# Patient Record
Sex: Male | Born: 1935
Health system: Southern US, Community
[De-identification: ages and names within clinical notes are randomized; demographics above are authoritative.]

## PROBLEM LIST (undated history)

## (undated) DIAGNOSIS — J690 Pneumonitis due to inhalation of food and vomit: Secondary | ICD-10-CM

## (undated) DIAGNOSIS — D649 Anemia, unspecified: Secondary | ICD-10-CM

## (undated) DIAGNOSIS — K449 Diaphragmatic hernia without obstruction or gangrene: Secondary | ICD-10-CM

## (undated) DIAGNOSIS — I10 Essential (primary) hypertension: Secondary | ICD-10-CM

## (undated) DIAGNOSIS — K225 Diverticulum of esophagus, acquired: Secondary | ICD-10-CM

## (undated) DIAGNOSIS — E785 Hyperlipidemia, unspecified: Secondary | ICD-10-CM

## (undated) HISTORY — DX: Hyperlipidemia, unspecified: E78.5

## (undated) HISTORY — DX: Diaphragmatic hernia without obstruction or gangrene: K44.9

## (undated) HISTORY — DX: Pneumonitis due to inhalation of food and vomit: J69.0

## (undated) HISTORY — PX: BILATERAL VATS ABLATION: SHX1224

## (undated) HISTORY — DX: Diverticulum of esophagus, acquired: K22.5

## (undated) HISTORY — DX: Essential (primary) hypertension: I10

## (undated) HISTORY — DX: Anemia, unspecified: D64.9

---

## 2009-02-22 DIAGNOSIS — J690 Pneumonitis due to inhalation of food and vomit: Secondary | ICD-10-CM

## 2009-02-22 HISTORY — DX: Pneumonitis due to inhalation of food and vomit: J69.0

## 2010-01-29 ENCOUNTER — Inpatient Hospital Stay (HOSPITAL_COMMUNITY)
Admission: AD | Admit: 2010-01-29 | Discharge: 2010-02-12 | Payer: Self-pay | Attending: Pulmonary Disease | Admitting: Pulmonary Disease

## 2010-02-02 ENCOUNTER — Encounter (INDEPENDENT_AMBULATORY_CARE_PROVIDER_SITE_OTHER): Payer: Self-pay | Admitting: Pulmonary Disease

## 2010-02-20 ENCOUNTER — Ambulatory Visit: Payer: Self-pay | Admitting: Cardiothoracic Surgery

## 2010-03-06 ENCOUNTER — Ambulatory Visit
Admission: RE | Admit: 2010-03-06 | Discharge: 2010-03-06 | Payer: Self-pay | Source: Home / Self Care | Attending: Cardiothoracic Surgery | Admitting: Cardiothoracic Surgery

## 2010-03-06 ENCOUNTER — Encounter
Admission: RE | Admit: 2010-03-06 | Discharge: 2010-03-06 | Payer: Self-pay | Source: Home / Self Care | Attending: Cardiothoracic Surgery | Admitting: Cardiothoracic Surgery

## 2010-03-06 ENCOUNTER — Encounter: Payer: Self-pay | Admitting: Internal Medicine

## 2010-03-17 ENCOUNTER — Ambulatory Visit
Admission: RE | Admit: 2010-03-17 | Discharge: 2010-03-17 | Payer: Self-pay | Source: Home / Self Care | Attending: Internal Medicine | Admitting: Internal Medicine

## 2010-03-19 DIAGNOSIS — E785 Hyperlipidemia, unspecified: Secondary | ICD-10-CM | POA: Insufficient documentation

## 2010-03-19 DIAGNOSIS — J690 Pneumonitis due to inhalation of food and vomit: Secondary | ICD-10-CM | POA: Insufficient documentation

## 2010-03-19 DIAGNOSIS — I10 Essential (primary) hypertension: Secondary | ICD-10-CM | POA: Insufficient documentation

## 2010-03-19 DIAGNOSIS — K219 Gastro-esophageal reflux disease without esophagitis: Secondary | ICD-10-CM | POA: Insufficient documentation

## 2010-03-20 ENCOUNTER — Encounter: Payer: Self-pay | Admitting: Internal Medicine

## 2010-03-23 ENCOUNTER — Ambulatory Visit: Admit: 2010-03-23 | Payer: Self-pay | Admitting: Internal Medicine

## 2010-03-25 ENCOUNTER — Encounter: Payer: Self-pay | Admitting: Cardiothoracic Surgery

## 2010-03-26 NOTE — Assessment & Plan Note (Signed)
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 DC summary reviewed. He was found to have large hiatal hernia, Zenker's diverticulum, esophageal stricture, poor swallowing mechanics with reflux, anemia of chronic disease and probably iron loss. He was continued on Rocephin and azithromycin begun at [facility] before transfer, then discharged on Omnicef. He underwent thoracentesis then VATs throacotomy for treament of the empyema by Dr Donata Clay, who is seeing him in outpatient f/u and did CXR, as is Dr Randa Evens for GI. He has said he was walking 57miles/ day before he got sick, and that he is back to 5 miles now. Denies dyspnea, little cough, no chokiung when he eats or drinks, and not waking choking from sleep to suggest reflux events.    Past History:  Past Medical History: Hyperlipidemia Hypertension Aspiration pneumonia 2011 Empyema 2011 Hiatal hernia esophageal stricture Zenkers diverticulum anemia- iron def  Past Surgical History: Right VATs for empyema 2011- Dr Maren Beach  Review of Systems      See HPI       The patient complains of chest pain.  The patient denies anorexia, fever, weight loss, weight gain, vision loss, decreased hearing, hoarseness, syncope, dyspnea on exertion, peripheral edema, prolonged cough, headaches, hemoptysis, abdominal pain, severe indigestion/heartburn, suspicious skin lesions, unusual weight change, abnormal bleeding, enlarged lymph nodes, and angioedema.         post thoracotomy pain  Physical Exam  Additional Exam:  General: A/Ox3; pleasant and cooperative, NAD, alert, calm SKIN: no rash, lesions NODES: no lymphadenopathy HEENT: [place]/AT, EOM- WNL, Conjuctivae- clear, PERRLA, TM-WNL, Nose- clear, Throat- clear and wnl, Mallampati  II NECK: Supple w/  fair ROM, JVD- none, normal carotid impulses w/o bruits Thyroid- normal to palpation, hoarse, no stridor CHEST: Diminished on right with crackles; clear on left HEART: RRR, no m/g/r heard ABDOMEN: Soft and nl; nml bowel sounds; no organomegaly or masses noted ZOX:WRUE, nl pulses, no edema  NEURO: Grossly intact to observation, ambulatory      Impression & Recommendations:  Problem # 1:  ASPIRATION PNEUMONIA (ICD-507.0)  He is resolving the pneumonia and does not recognize any aspriation event. The main concern is ongoing risk of further reflux and aspiration events. He did not have recognized lung disease at baseline, and he is oxygenating well. By his description he is resuming his impressive daily walking habit, which is great. He will need to have anemia and general medical care tracked by Dr Clarene Duke. He has Thoracic surgery f/u with Dr Maren Beach and GI f/u with Dr Randa Evens, so I will be happy to see him back as needed.   Orders: Consultation Level IV ([id number])  Patient Instructions: 1)  Please schedule a follow-up appointment as needed. 2)  cc Dr Randa Evens, Dr Clarene Duke, Dr Maren Beach

## 2010-04-01 ENCOUNTER — Ambulatory Visit (INDEPENDENT_AMBULATORY_CARE_PROVIDER_SITE_OTHER): Payer: Self-pay | Admitting: Cardiothoracic Surgery

## 2010-04-01 ENCOUNTER — Encounter: Payer: Self-pay | Admitting: Internal Medicine

## 2010-04-01 DIAGNOSIS — J869 Pyothorax without fistula: Secondary | ICD-10-CM

## 2010-04-03 NOTE — Assessment & Plan Note (Signed)
OFFICE VISIT  Antonio Vaughan, Antonio Vaughan DOB:  Dec 25, 1920                                        April 01, 2010 CHART #:  [id number]  CURRENT PROBLEMS: 1. Status post right video-assisted thoracoscopic decortication of     empyema secondary to Klebsiella pneumonia, January 29, 2010. 2. Zenker diverticulum of the cervical esophagus.  HISTORY OF PRESENT ILLNESS:  The patient is an 77 year old male returns for further followup after undergoing right VATS decortication of empyema in mid December 2011.  He is now doing well.  His weight has stabilized at 135 pounds.  He is walking 5 miles a day.  He denies any difficulty swallowing or signs of aspiration.  He has some mild postthoracotomy pain.  MEDICATIONS:  He remains on his baseline meds including Diovan 160, Norvasc 10 mg, and Crestor 10 mg a day.  He is also on Flomax and Prilosec p.r.n.  PHYSICAL EXAMINATION:  Vital Signs:  Blood pressure 170/80, pulse 78, respirations 20, saturation 97%, and weight 135 pounds.  General:  He is alert and pleasant and feels well.  Lungs:  Breath sounds are clear and equal.  The right thoracotomy incision is healed.  Cardiac:  Rhythm is regular.  Abdomen:  Benign.  Neurologic:  Intact and there is no pedal edema.  IMPRESSION AND PLAN:  The patient is recovered from his decortication for empyema and is back to a baseline weight with good energy level.  We discussed surgical options of treating his Zenker diverticulum to prevent further aspiration pneumonia.  At this point, he feels very well and does not wish to undergo further surgery.  At this time, his wife is having significant problems with peripheral vascular disease and pending possible surgery.  He will return in approximately 8 weeks to review the situation again and discuss possible surgical correction of the Zenker diverticulum.  I think, this is a reasonable plan and we will follow up with the patient later this  spring.  Kerin Perna, M.D. Electronically Signed  PV/MEDQ  D:  04/01/2010  T:  04/02/2010  Job:  [id number]  cc:   Joni Fears D. Young, MD, FCCP, FACP James L. Malon Kindle., M.D.

## 2010-04-09 NOTE — Letter (Signed)
Summary: Kathlee Nations Trigt MD  Kathlee Nations Trigt MD   Imported By: Lester [place] 03/30/2010 11:05:03  _____________________________________________________________________  External Attachment:    Type:   Image     Comment:   External Document

## 2010-04-09 NOTE — Miscellaneous (Signed)
Summary: Face to Face Encounter  Face to Face Encounter   Imported By: Sherian Rein 03/31/2010 09:32:33  _____________________________________________________________________  External Attachment:    Type:   Image     Comment:   External Document

## 2010-04-15 NOTE — Letter (Signed)
Summary: Antonio Nations Trigt  MD  Antonio Nations Trigt  MD   Imported By: Lennie Odor 04/10/2010 09:33:21  _____________________________________________________________________  External Attachment:    Type:   Image     Comment:   External Document

## 2010-04-21 NOTE — Letter (Signed)
Summary: [facility]  [facility]   Imported By: Sherian Rein 04/13/2010 07:17:27  _____________________________________________________________________  External Attachment:    Type:   Image     Comment:   External Document

## 2010-05-04 LAB — CBC
HCT: 26.4 % — ABNORMAL LOW (ref 39.0–52.0)
HCT: 29.3 % — ABNORMAL LOW (ref 39.0–52.0)
HCT: 29.4 % — ABNORMAL LOW (ref 39.0–52.0)
Hemoglobin: 8.7 g/dL — ABNORMAL LOW (ref 13.0–17.0)
Hemoglobin: 9.5 g/dL — ABNORMAL LOW (ref 13.0–17.0)
Hemoglobin: 9.7 g/dL — ABNORMAL LOW (ref 13.0–17.0)
MCH: 30.8 pg (ref 26.0–34.0)
MCH: 31.2 pg (ref 26.0–34.0)
MCH: 31.4 pg (ref 26.0–34.0)
MCHC: 32.4 g/dL (ref 30.0–36.0)
MCHC: 33 g/dL (ref 30.0–36.0)
MCHC: 33 g/dL (ref 30.0–36.0)
MCV: 95.1 fL (ref 78.0–100.0)
MCV: 95.1 fL (ref 78.0–100.0)
Platelets: 177 10*3/uL (ref 150–400)
Platelets: 179 10*3/uL (ref 150–400)
Platelets: 183 10*3/uL (ref 150–400)
RBC: 3.08 MIL/uL — ABNORMAL LOW (ref 4.22–5.81)
RBC: 3.09 MIL/uL — ABNORMAL LOW (ref 4.22–5.81)
RBC: 3.14 MIL/uL — ABNORMAL LOW (ref 4.22–5.81)
RDW: 14 % (ref 11.5–15.5)
RDW: 14 % (ref 11.5–15.5)
RDW: 14.1 % (ref 11.5–15.5)
WBC: 3.2 10*3/uL — ABNORMAL LOW (ref 4.0–10.5)
WBC: 4.5 10*3/uL (ref 4.0–10.5)
WBC: 8.5 10*3/uL (ref 4.0–10.5)

## 2010-05-04 LAB — GLUCOSE, CAPILLARY
Glucose-Capillary: 101 mg/dL — ABNORMAL HIGH (ref 70–99)
Glucose-Capillary: 104 mg/dL — ABNORMAL HIGH (ref 70–99)
Glucose-Capillary: 109 mg/dL — ABNORMAL HIGH (ref 70–99)
Glucose-Capillary: 115 mg/dL — ABNORMAL HIGH (ref 70–99)
Glucose-Capillary: 129 mg/dL — ABNORMAL HIGH (ref 70–99)
Glucose-Capillary: 130 mg/dL — ABNORMAL HIGH (ref 70–99)
Glucose-Capillary: 136 mg/dL — ABNORMAL HIGH (ref 70–99)
Glucose-Capillary: 140 mg/dL — ABNORMAL HIGH (ref 70–99)
Glucose-Capillary: 82 mg/dL (ref 70–99)
Glucose-Capillary: 83 mg/dL (ref 70–99)
Glucose-Capillary: 83 mg/dL (ref 70–99)
Glucose-Capillary: 87 mg/dL (ref 70–99)
Glucose-Capillary: 88 mg/dL (ref 70–99)
Glucose-Capillary: 88 mg/dL (ref 70–99)
Glucose-Capillary: 92 mg/dL (ref 70–99)
Glucose-Capillary: 96 mg/dL (ref 70–99)
Glucose-Capillary: 97 mg/dL (ref 70–99)

## 2010-05-04 LAB — DIFFERENTIAL
Basophils Absolute: 0 10*3/uL (ref 0.0–0.1)
Basophils Absolute: 0 10*3/uL (ref 0.0–0.1)
Basophils Relative: 0 % (ref 0–1)
Basophils Relative: 1 % (ref 0–1)
Eosinophils Absolute: 0.1 10*3/uL (ref 0.0–0.7)
Eosinophils Relative: 1 % (ref 0–5)
Eosinophils Relative: 1 % (ref 0–5)
Lymphocytes Relative: 23 % (ref 12–46)
Lymphocytes Relative: 8 % — ABNORMAL LOW (ref 12–46)
Lymphocytes Relative: 9 % — ABNORMAL LOW (ref 12–46)
Lymphs Abs: 0.6 10*3/uL — ABNORMAL LOW (ref 0.7–4.0)
Lymphs Abs: 0.7 10*3/uL (ref 0.7–4.0)
Monocytes Absolute: 0.3 10*3/uL (ref 0.1–1.0)
Monocytes Absolute: 0.5 10*3/uL (ref 0.1–1.0)
Monocytes Relative: 11 % (ref 3–12)
Monocytes Relative: 5 % (ref 3–12)
Monocytes Relative: 5 % (ref 3–12)
Neutro Abs: 1.9 10*3/uL (ref 1.7–7.7)
Neutro Abs: 7.3 10*3/uL (ref 1.7–7.7)
Neutrophils Relative %: 61 % (ref 43–77)
Neutrophils Relative %: 86 % — ABNORMAL HIGH (ref 43–77)

## 2010-05-04 LAB — COMPREHENSIVE METABOLIC PANEL
ALT: 27 U/L (ref 0–53)
AST: 25 U/L (ref 0–37)
AST: 32 U/L (ref 0–37)
Albumin: 1.8 g/dL — ABNORMAL LOW (ref 3.5–5.2)
Albumin: 1.9 g/dL — ABNORMAL LOW (ref 3.5–5.2)
Alkaline Phosphatase: 47 U/L (ref 39–117)
BUN: 11 mg/dL (ref 6–23)
CO2: 28 mEq/L (ref 19–32)
Calcium: 7.7 mg/dL — ABNORMAL LOW (ref 8.4–10.5)
Calcium: 8.1 mg/dL — ABNORMAL LOW (ref 8.4–10.5)
Chloride: 102 mEq/L (ref 96–112)
Chloride: 103 mEq/L (ref 96–112)
Creatinine, Ser: 1.1 mg/dL (ref 0.4–1.5)
Creatinine, Ser: 1.22 mg/dL (ref 0.4–1.5)
GFR calc Af Amer: >60 mL/min (ref >60)
GFR calc Af Amer: >60 mL/min (ref >60)
GFR calc non Af Amer: 56 mL/min — ABNORMAL LOW (ref >60)
Glucose, Bld: 96 mg/dL (ref 70–99)
Potassium: 4 mEq/L (ref 3.5–5.1)
Sodium: 133 mEq/L — ABNORMAL LOW (ref 135–145)
Total Bilirubin: 0.6 mg/dL (ref 0.3–1.2)
Total Bilirubin: 0.7 mg/dL (ref 0.3–1.2)
Total Protein: 4.9 g/dL — ABNORMAL LOW (ref 6.0–8.3)
Total Protein: 5 g/dL — ABNORMAL LOW (ref 6.0–8.3)

## 2010-05-04 LAB — BASIC METABOLIC PANEL
BUN: 16 mg/dL (ref 6–23)
CO2: 29 mEq/L (ref 19–32)
CO2: 30 mEq/L (ref 19–32)
Calcium: 8.4 mg/dL (ref 8.4–10.5)
Calcium: 8.7 mg/dL (ref 8.4–10.5)
Calcium: 9 mg/dL (ref 8.4–10.5)
Chloride: 97 mEq/L (ref 96–112)
Chloride: 97 mEq/L (ref 96–112)
Creatinine, Ser: 0.97 mg/dL (ref 0.4–1.5)
Creatinine, Ser: 0.98 mg/dL (ref 0.4–1.5)
Creatinine, Ser: 1.15 mg/dL (ref 0.4–1.5)
GFR calc Af Amer: >60 mL/min (ref >60)
GFR calc Af Amer: >60 mL/min (ref >60)
GFR calc Af Amer: >60 mL/min (ref >60)
GFR calc non Af Amer: 55 mL/min — ABNORMAL LOW (ref >60)
GFR calc non Af Amer: >60 mL/min (ref >60)
GFR calc non Af Amer: >60 mL/min (ref >60)
Glucose, Bld: 100 mg/dL — ABNORMAL HIGH (ref 70–99)
Glucose, Bld: 88 mg/dL (ref 70–99)
Potassium: 3.8 mEq/L (ref 3.5–5.1)
Potassium: 4 mEq/L (ref 3.5–5.1)
Potassium: 4.2 mEq/L (ref 3.5–5.1)
Sodium: 132 mEq/L — ABNORMAL LOW (ref 135–145)
Sodium: 135 mEq/L (ref 135–145)
Sodium: 135 mEq/L (ref 135–145)
Sodium: 136 mEq/L (ref 135–145)

## 2010-05-04 LAB — CHOLESTEROL, TOTAL: Cholesterol: 85 mg/dL (ref 0–200)

## 2010-05-04 LAB — AMYLASE: Amylase: 43 U/L (ref 0–105)

## 2010-05-04 LAB — MAGNESIUM: Magnesium: 1.9 mg/dL (ref 1.5–2.5)

## 2010-05-04 LAB — TRIGLYCERIDES: Triglycerides: 87 mg/dL (ref <150)

## 2010-05-04 LAB — PHOSPHORUS: Phosphorus: 2.6 mg/dL (ref 2.3–4.6)

## 2010-05-05 LAB — GLUCOSE, CAPILLARY
Glucose-Capillary: 104 mg/dL — ABNORMAL HIGH (ref 70–99)
Glucose-Capillary: 110 mg/dL — ABNORMAL HIGH (ref 70–99)
Glucose-Capillary: 118 mg/dL — ABNORMAL HIGH (ref 70–99)
Glucose-Capillary: 142 mg/dL — ABNORMAL HIGH (ref 70–99)

## 2010-05-05 LAB — DIFFERENTIAL
Eosinophils Absolute: 0.1 10*3/uL (ref 0.0–0.7)
Eosinophils Relative: 2 % (ref 0–5)
Lymphs Abs: 0.9 10*3/uL (ref 0.7–4.0)
Monocytes Absolute: 0.5 10*3/uL (ref 0.1–1.0)
Monocytes Relative: 12 % (ref 3–12)
Neutrophils Relative %: 67 % (ref 43–77)

## 2010-05-05 LAB — BASIC METABOLIC PANEL
BUN: 6 mg/dL (ref 6–23)
BUN: 8 mg/dL (ref 6–23)
CO2: 25 mEq/L (ref 19–32)
CO2: 29 mEq/L (ref 19–32)
Calcium: 7.8 mg/dL — ABNORMAL LOW (ref 8.4–10.5)
Calcium: 8.5 mg/dL (ref 8.4–10.5)
Chloride: 104 mEq/L (ref 96–112)
Chloride: 105 mEq/L (ref 96–112)
Creatinine, Ser: 1.1 mg/dL (ref 0.4–1.5)
Creatinine, Ser: 1.24 mg/dL (ref 0.4–1.5)
GFR calc Af Amer: >60 mL/min (ref >60)
GFR calc Af Amer: >60 mL/min (ref >60)
GFR calc non Af Amer: 55 mL/min — ABNORMAL LOW (ref >60)
GFR calc non Af Amer: >60 mL/min (ref >60)
Glucose, Bld: 186 mg/dL — ABNORMAL HIGH (ref 70–99)
Glucose, Bld: 90 mg/dL (ref 70–99)
Potassium: 4 mEq/L (ref 3.5–5.1)
Potassium: 4.1 mEq/L (ref 3.5–5.1)
Sodium: 135 mEq/L (ref 135–145)
Sodium: 140 mEq/L (ref 135–145)

## 2010-05-05 LAB — COMPREHENSIVE METABOLIC PANEL
ALT: 31 U/L (ref 0–53)
ALT: 52 U/L (ref 0–53)
ALT: 94 U/L — ABNORMAL HIGH (ref 0–53)
AST: 26 U/L (ref 0–37)
AST: 40 U/L — ABNORMAL HIGH (ref 0–37)
AST: 87 U/L — ABNORMAL HIGH (ref 0–37)
Albumin: 2.1 g/dL — ABNORMAL LOW (ref 3.5–5.2)
Albumin: 2.2 g/dL — ABNORMAL LOW (ref 3.5–5.2)
Albumin: 2.2 g/dL — ABNORMAL LOW (ref 3.5–5.2)
Alkaline Phosphatase: 56 U/L (ref 39–117)
Alkaline Phosphatase: 58 U/L (ref 39–117)
BUN: 11 mg/dL (ref 6–23)
BUN: 6 mg/dL (ref 6–23)
CO2: 27 mEq/L (ref 19–32)
CO2: 29 mEq/L (ref 19–32)
Calcium: 7.9 mg/dL — ABNORMAL LOW (ref 8.4–10.5)
Calcium: 8.2 mg/dL — ABNORMAL LOW (ref 8.4–10.5)
Calcium: 8.6 mg/dL (ref 8.4–10.5)
Chloride: 102 mEq/L (ref 96–112)
Chloride: 105 mEq/L (ref 96–112)
Creatinine, Ser: 1.27 mg/dL (ref 0.4–1.5)
Creatinine, Ser: 1.28 mg/dL (ref 0.4–1.5)
GFR calc Af Amer: >60 mL/min (ref >60)
GFR calc Af Amer: >60 mL/min (ref >60)
GFR calc Af Amer: >60 mL/min (ref >60)
GFR calc non Af Amer: 53 mL/min — ABNORMAL LOW (ref >60)
GFR calc non Af Amer: 54 mL/min — ABNORMAL LOW (ref >60)
Glucose, Bld: 83 mg/dL (ref 70–99)
Glucose, Bld: 90 mg/dL (ref 70–99)
Potassium: 3.4 mEq/L — ABNORMAL LOW (ref 3.5–5.1)
Potassium: 3.9 mEq/L (ref 3.5–5.1)
Potassium: 4.1 mEq/L (ref 3.5–5.1)
Sodium: 134 mEq/L — ABNORMAL LOW (ref 135–145)
Sodium: 137 mEq/L (ref 135–145)
Sodium: 141 mEq/L (ref 135–145)
Total Bilirubin: 0.5 mg/dL (ref 0.3–1.2)
Total Bilirubin: 0.6 mg/dL (ref 0.3–1.2)
Total Protein: 5.4 g/dL — ABNORMAL LOW (ref 6.0–8.3)
Total Protein: 5.7 g/dL — ABNORMAL LOW (ref 6.0–8.3)
Total Protein: 6.1 g/dL (ref 6.0–8.3)

## 2010-05-05 LAB — BLOOD GAS, ARTERIAL
Acid-Base Excess: 2.1 mmol/L — ABNORMAL HIGH (ref 0.0–2.0)
Acid-Base Excess: 3.7 mmol/L — ABNORMAL HIGH (ref 0.0–2.0)
Bicarbonate: 25.7 mEq/L — ABNORMAL HIGH (ref 20.0–24.0)
Bicarbonate: 27 mEq/L — ABNORMAL HIGH (ref 20.0–24.0)
Drawn by: 22251
Drawn by: 30575
FIO2: 21 %
O2 Content: 5 L/min
O2 Saturation: 94.2 %
O2 Saturation: 96.2 %
Patient temperature: 98.6
Patient temperature: 98.6
TCO2: 26.8 mmol/L (ref 0–100)
TCO2: 28.1 mmol/L (ref 0–100)
pCO2 arterial: 35.8 mmHg (ref 35.0–45.0)
pCO2 arterial: 37.2 mmHg (ref 35.0–45.0)
pH, Arterial: 7.454 — ABNORMAL HIGH (ref 7.350–7.450)
pH, Arterial: 7.491 — ABNORMAL HIGH (ref 7.350–7.450)
pO2, Arterial: 64.4 mmHg — ABNORMAL LOW (ref 80.0–100.0)
pO2, Arterial: 70.4 mmHg — ABNORMAL LOW (ref 80.0–100.0)

## 2010-05-05 LAB — CBC
HCT: 26.9 % — ABNORMAL LOW (ref 39.0–52.0)
HCT: 27.5 % — ABNORMAL LOW (ref 39.0–52.0)
HCT: 31.5 % — ABNORMAL LOW (ref 39.0–52.0)
HCT: 32.2 % — ABNORMAL LOW (ref 39.0–52.0)
Hemoglobin: 10.3 g/dL — ABNORMAL LOW (ref 13.0–17.0)
Hemoglobin: 10.5 g/dL — ABNORMAL LOW (ref 13.0–17.0)
Hemoglobin: 8.8 g/dL — ABNORMAL LOW (ref 13.0–17.0)
Hemoglobin: 9 g/dL — ABNORMAL LOW (ref 13.0–17.0)
MCH: 30.7 pg (ref 26.0–34.0)
MCH: 31 pg (ref 26.0–34.0)
MCH: 31.4 pg (ref 26.0–34.0)
MCH: 31.7 pg (ref 26.0–34.0)
MCHC: 32.6 g/dL (ref 30.0–36.0)
MCHC: 32.7 g/dL (ref 30.0–36.0)
MCHC: 32.7 g/dL (ref 30.0–36.0)
MCHC: 32.7 g/dL (ref 30.0–36.0)
MCHC: 32.9 g/dL (ref 30.0–36.0)
MCV: 93.8 fL (ref 78.0–100.0)
MCV: 95 fL (ref 78.0–100.0)
MCV: 96.1 fL (ref 78.0–100.0)
MCV: 96.8 fL (ref 78.0–100.0)
Platelets: 205 10*3/uL (ref 150–400)
Platelets: 225 10*3/uL (ref 150–400)
Platelets: 251 10*3/uL (ref 150–400)
Platelets: 264 10*3/uL (ref 150–400)
RBC: 2.8 MIL/uL — ABNORMAL LOW (ref 4.22–5.81)
RBC: 2.84 MIL/uL — ABNORMAL LOW (ref 4.22–5.81)
RBC: 3.36 MIL/uL — ABNORMAL LOW (ref 4.22–5.81)
RBC: 3.39 MIL/uL — ABNORMAL LOW (ref 4.22–5.81)
RDW: 12.8 % (ref 11.5–15.5)
RDW: 13.1 % (ref 11.5–15.5)
RDW: 13.1 % (ref 11.5–15.5)
RDW: 14.1 % (ref 11.5–15.5)
RDW: 14.3 % (ref 11.5–15.5)
WBC: 11.1 10*3/uL — ABNORMAL HIGH (ref 4.0–10.5)
WBC: 4.5 10*3/uL (ref 4.0–10.5)
WBC: 4.6 10*3/uL (ref 4.0–10.5)
WBC: 5.3 10*3/uL (ref 4.0–10.5)
WBC: 9.6 10*3/uL (ref 4.0–10.5)

## 2010-05-05 LAB — PROTIME-INR
INR: 1.27 (ref 0.00–1.49)
INR: 1.31 (ref 0.00–1.49)
Prothrombin Time: 16.5 seconds — ABNORMAL HIGH (ref 11.6–15.2)

## 2010-05-05 LAB — URINALYSIS, ROUTINE W REFLEX MICROSCOPIC
Glucose, UA: NEGATIVE mg/dL
Leukocytes, UA: NEGATIVE
Protein, ur: NEGATIVE mg/dL
Specific Gravity, Urine: 1.007 (ref 1.005–1.030)

## 2010-05-05 LAB — CROSSMATCH
ABO/RH(D): A POS
Antibody Screen: NEGATIVE
Unit division: 0
Unit division: 0

## 2010-05-05 LAB — POCT I-STAT 3, ART BLOOD GAS (G3+)
Acid-Base Excess: 1 mmol/L (ref 0.0–2.0)
Bicarbonate: 26.3 mEq/L — ABNORMAL HIGH (ref 20.0–24.0)
TCO2: 28 mmol/L (ref 0–100)
pH, Arterial: 7.414 (ref 7.350–7.450)
pO2, Arterial: 108 mmHg — ABNORMAL HIGH (ref 80.0–100.0)

## 2010-05-05 LAB — ABO/RH: ABO/RH(D): A POS

## 2010-05-05 LAB — ANAEROBIC CULTURE

## 2010-05-05 LAB — URINE MICROSCOPIC-ADD ON

## 2010-05-05 LAB — HEMOCCULT GUIAC POC 1CARD (OFFICE): Fecal Occult Bld: NEGATIVE

## 2010-05-05 LAB — BODY FLUID CULTURE: Culture: NO GROWTH

## 2010-05-05 LAB — APTT: aPTT: 33 seconds (ref 24–37)

## 2010-06-02 ENCOUNTER — Other Ambulatory Visit: Payer: Self-pay | Admitting: Cardiothoracic Surgery

## 2010-06-02 DIAGNOSIS — D381 Neoplasm of uncertain behavior of trachea, bronchus and lung: Secondary | ICD-10-CM

## 2010-06-03 ENCOUNTER — Ambulatory Visit
Admission: RE | Admit: 2010-06-03 | Discharge: 2010-06-03 | Disposition: A | Discharge status: Home / Self Care | Payer: Medicare Other | Source: Ambulatory Visit | Attending: Cardiothoracic Surgery | Admitting: Cardiothoracic Surgery

## 2010-06-03 ENCOUNTER — Ambulatory Visit (INDEPENDENT_AMBULATORY_CARE_PROVIDER_SITE_OTHER): Payer: Medicare Other | Admitting: Cardiothoracic Surgery

## 2010-06-03 DIAGNOSIS — D381 Neoplasm of uncertain behavior of trachea, bronchus and lung: Secondary | ICD-10-CM

## 2010-06-03 DIAGNOSIS — J869 Pyothorax without fistula: Secondary | ICD-10-CM

## 2010-06-04 NOTE — Assessment & Plan Note (Signed)
OFFICE VISIT  JENNIFER, HOLLAND DOB:  October 14, 1920                                        June 03, 2010 CHART #:  [id number]  CURRENT PROBLEMS: 1. Status post right video-assisted thoracoscopic surgery,     decortication of empyema secondary to Klebsiella pneumoniae in     December 2011. 2. Moderate-sized Zenker diverticulum of the cervical esophagus with     mild spasm of a cricopharyngeus muscle.  HISTORY PRESENT ILLNESS:  Mr. Tennyson is an 77 year old gentleman who returns for a 73-month followup with chest x-ray after undergoing right VATS and decortication for an aspiration pneumonia, probably related to a Zenker diverticulum that was found during his hospitalization with an upper GI series.  Clinically, he denies difficulty swallowing, choking, bad breath, or other swallowing issues.  His weight has been stable and his overall strength is now return almost back to normal following his surgery in December.  He has had no history of repeated pulmonary infections over the years and is a nonsmoker.  He is still helping his wife considerably with her rehab process after undergoing a vascular surgery for ischemic leg ulcer earlier this year.  His medications remain unchanged and include: 1. Crestor 10 mg. 2. Diovan 160 mg. 3. Flomax 0.4 mg. 4. Norvasc 10 mg. 5. Prilosec 1 daily.  His primary care physician remains Dr. Clarene Duke in [facility].  PHYSICAL EXAMINATION:  VITAL SIGNS:  Blood pressure 160/90, pulse 80 and regular, respirations 18, saturation 97% on room air, and temperature afebrile. GENERAL:  He appears to be feeling very well today and is very comfortable and alert. LUNGS:  Breath sounds are clear and equal and thoracotomy incision is well healed. NECK:  There is no neck mass or crepitus. CARDIAC:  Cardiac rhythm is regular.  There is no pedal edema.  His cardiac exam is regular rhythm without murmur.  LABORATORY DATA:  Chest x-ray today  shows significant improvement in the aeration of the right lung base which is now only with some mild pleural thickening.  IMPRESSION AND PLAN:  The patient has recovered well from the right video-assisted thoracoscopic surgery, decortication thoracic surgery.  I discussed and do her diagram of the Zenker diverticulum and his cervical esophagus with the explanation of possible further aspiration and pneumonia as a real possibility.  However, at age 87, he feels he would rather take his chances with his current situation and undergo surgery, especially as his wife is still very dependent on his assistance during her rehab from her operation.  I did discuss with him the importance of careful swallowing and small muffles of food and we will plan on seeing him back in 3 months with chest x-ray for followup and further discussion.  Kerin Perna, M.D. Electronically Signed  PV/MEDQ  D:  06/03/2010  T:  06/04/2010  Job:  [id number]  cc:   Jeanmarie Plant Clinton D. Maple Hudson, MD, FCCP, FACP

## 2010-06-16 ENCOUNTER — Encounter: Payer: Self-pay | Admitting: Internal Medicine

## 2010-06-18 ENCOUNTER — Encounter: Payer: Self-pay | Admitting: Internal Medicine

## 2010-06-18 ENCOUNTER — Ambulatory Visit (INDEPENDENT_AMBULATORY_CARE_PROVIDER_SITE_OTHER): Payer: Medicare Other | Admitting: Internal Medicine

## 2010-06-18 VITALS — BP 164/82 | HR 49 | Ht 65.0 in | Wt 150.6 lb

## 2010-06-18 DIAGNOSIS — J690 Pneumonitis due to inhalation of food and vomit: Secondary | ICD-10-CM

## 2010-06-18 DIAGNOSIS — K219 Gastro-esophageal reflux disease without esophagitis: Secondary | ICD-10-CM

## 2010-06-18 NOTE — Patient Instructions (Signed)
Please call as needed 

## 2010-06-18 NOTE — Assessment & Plan Note (Signed)
He is doing well and is under good care and supervision through his other doctors. To avoid redundancy, I will offer to see him back as needed.  He is clearly at long term risk of recurrent aspiration events.

## 2010-06-18 NOTE — Progress Notes (Signed)
  Subjective:    Patient ID: Antonio Vaughan, male    DOB: [birth year], 77 y.o.   MRN: [id number]  HPI 77 yo M never smoker with hx severe aspiration pneumonia and empyema secondary to [facility] with aspiration. He had required surgical empyema drainage. Last here March 17, 2010. Since then he has done very well. He saw his primary physician recently with blood work. He says he has felt very well and that he walks 5 miles, ti[place] and goes to [place]. He sits up to eat and avoids lying down after eating. Dr Donata Clay saw him last month with CXR. and will see him in the winter.  He denies routine cough or phlegm  Review of Systems See HPI Constitutional:   No weight loss, night sweats,  Fevers, chills, fatigue, lassitude. HEENT:   No headaches,  Difficulty swallowing,  Tooth/dental problems,  Sore throat,                No sneezing, itching, ear ache, nasal congestion, post nasal drip,   CV:  No chest pain,  Orthopnea, PND, swelling in lower extremities, anasarca, dizziness, palpitations  GI  No heartburn, indigestion, abdominal pain, nausea, vomiting, diarrhea, change in bowel habits, loss of appetite  Resp: No shortness of breath with exertion or at rest.  No excess mucus, no productive cough,  No non-productive cough,  No coughing up of blood.  No change in color of mucus.  No wheezing.   Skin: no rash or lesions.  GU: no dysuria, change in color of urine, no urgency or frequency.  No flank pain.  MS:  No joint pain or swelling.  No decreased range of motion.  No back pain.  Psych:  No change in mood or affect. No depression or anxiety.  No memory loss.          Objective:   Physical Exam General- Alert, Oriented, Affect-appropriate, Distress- none acute  Skin- rash-none, lesions- none, excoriation- none  Lymphadenopathy- none  Head- atraumatic  Eyes- Gross vision intact, PERRLA, conjunctivae clear secretions  Ears- Hearing, canals, Tm L R ,  Nose- Clear, No- Septal dev, mucus, polyps,  erosion, perforation   Throat- Mallampati II , mucosa clear , drainage- none, tonsils- atrophic  Neck- flexible , trachea midline, no stridor , thyroid nl, carotid no bruit  Chest - symmetrical excursion , unlabored     Heart/CV- RRR , no murmur , no gallop  , no rub, nl s1 s2                     - JVD- none , edema- none, stasis changes- none, varices- none     Lung- clear to P&A, wheeze- none, cough- none , dullness-none, rub- none I note throat clearing, but no cough and no rhonchi     Chest wall-   Abd- tender-no, distended-no, bowel sounds-present, HSM- no  Br/ Gen/ Rectal- Not done, not indicated  Extrem- cyanosis- none, clubbing, none, atrophy- none, strength- nl  Neuro- grossly intact to observation         Assessment & Plan:

## 2010-06-21 ENCOUNTER — Encounter: Payer: Self-pay | Admitting: Internal Medicine

## 2010-06-21 NOTE — Assessment & Plan Note (Signed)
He indicates he is following reflux precautions. He didn't seem to recognize his original precipitating reflux, so he will need to be watched for ongoing reflux. I re-enforced that point with him.

## 2010-07-07 NOTE — Assessment & Plan Note (Signed)
OFFICE VISIT   DWYANE, DUPREE  DOB:  Feb 02, 1921                                        March 06, 2010  CHART #:  [id number]   PROBLEM LIST:  1. Status post right video-assisted thoracoscopic surgery and      decortication of empyema on February 02, 2010.  2. Large Zenker diverticulum documented by postoperative upper      gastrointestinal.  3. Klebsiella right lower lobe pneumonia, resolved.   PRESENT ILLNESS:  The patient is a very nice 77 year old Caucasian male  nonsmoker who presented to the hospital with large chronic empyema  requiring thoracotomy and a full lung decortication in mid December.  Although sputum culture had grown out Klebsiella, the operative material  submitted for culture showed no growth.  He was given 3 weeks of empiric  antibiotics and has shown significant progressive improvement.  He has  been on aspiration precautions due to the Zenker diverticulum and has  been followed up by Dr. Randa Evens.  It was discussed in detail the  situation of the Zenker diverticulum and the risk for aspiration.   MEDICATIONS:  He continues his home medications including tramadol for  pain as well as Crestor, Diovan, Flomax, Norvasc, Prilosec, and iron  tablets.  He is able to walk 3-4 miles a day now, but he still feels his  strength is not back to normal.  He is eating well and he is still under  his preoperative weight.  The surgical incision has healed well.   PHYSICAL EXAMINATION:  Blood pressure 170/80, pulse 64, respirations 18,  saturation on room air 98%.  He is alert and pleasant.  The thoracotomy  incision is well healed.  He is good range of motion of the right upper  extremity.  Breath sounds are slightly diminished to the right base.  Cardiac rhythm is regular.  There is no pedal edema.   IMAGING:  PA and lateral chest x-ray shows significant improvement in  aeration at the right lung base.  There is still residual pleural  thickening and some postoperative changes.  Left lung is clear.   IMPRESSION AND PLAN:  The patient has done well 4 weeks following  surgery but still has not recovered.  I discussed the issue of the  Zenker diverticulum and I recommend that we proceed with surgical repair  of this once he has more fully recovered from his thoracotomy which will  take another 4-6 weeks.  I plan on seeing him back in the office in 4  weeks with a followup chest x-ray to assess his recovery and to discuss  timing of exclusion of the Zenker diverticulum with a myotomy of the  cricopharyngeus muscle.   Kerin Perna, M.D.  Electronically Signed   PV/MEDQ  D:  03/06/2010  T:  03/07/2010  Job:  [id number]   cc:   Joni Fears D. Young, MD, FCCP, FACP  James L. Malon Kindle., M.D.

## 2010-08-18 ENCOUNTER — Other Ambulatory Visit: Payer: Self-pay | Admitting: Cardiothoracic Surgery

## 2010-08-18 DIAGNOSIS — D381 Neoplasm of uncertain behavior of trachea, bronchus and lung: Secondary | ICD-10-CM

## 2010-08-19 ENCOUNTER — Ambulatory Visit
Admission: RE | Admit: 2010-08-19 | Discharge: 2010-08-19 | Disposition: A | Discharge status: Home / Self Care | Payer: Medicare Other | Source: Ambulatory Visit | Attending: Cardiothoracic Surgery | Admitting: Cardiothoracic Surgery

## 2010-08-19 ENCOUNTER — Ambulatory Visit (INDEPENDENT_AMBULATORY_CARE_PROVIDER_SITE_OTHER): Payer: Medicare Other | Admitting: Cardiothoracic Surgery

## 2010-08-19 DIAGNOSIS — K225 Diverticulum of esophagus, acquired: Secondary | ICD-10-CM

## 2010-08-19 DIAGNOSIS — D381 Neoplasm of uncertain behavior of trachea, bronchus and lung: Secondary | ICD-10-CM

## 2010-08-20 NOTE — Assessment & Plan Note (Signed)
OFFICE VISIT  Antonio Vaughan, Antonio Vaughan DOB:  [birth year]                                        August 19, 2010 CHART #:  [id number]  CURRENT PROBLEMS: 1. Status post right video-assisted thoracoscopic decortication for     empyema, December 2011. 2. Moderate-sized Zenker diverticulum of the cervical esophagus as     well as mild lower esophageal stricture.  PRESENT ILLNESS:  The patient is a very nice 77 year old Caucasian male, nonsmoker, returns for office followup after undergoing right VATS and decortication last December.  He is back to his regular weight and activity level and likes to walk a mile 3 days a week.  He denies difficulty swallowing or choking-coughing episodes during swallowing. He receives primary care from Dr. Clarene Duke in [facility].  PHYSICAL EXAMINATION:  His blood pressure is 160/78, pulse 60 and regular, saturation 98% on room air.  Appears to be a very healthy- appearing 77 year old gentleman in no distress.  Breath sounds are clear and equal.  The thoracotomy incision is well healed.  Cardiac rhythm is regular and there is no gallop or murmur.  LABORATORY DATA:  PA and lateral chest x-ray reveals improved aeration of the left lung base with only some mild pleural thickening and scarring.  No pleural effusion present.  IMPRESSION/PLAN:  The patient again was approached regarding surgical resection of the Zenker diverticulum.  He again states he feels fine and does not want surgery unless it would be absolutely necessary.  He appears to be following careful oral hygiene and careful swallowing during meals and denies any symptoms on careful questioning.  Although the right lower lobe pneumonia and subsequent empyema requiring decortication was certainly related to aspiration from the Zenker's, I respect the patient's wishes to avoid surgery which could be significantly risky to his age.  I would be happy to talk to the patient again  regarding surgery if his outlook changes.  Otherwise, he will return to the care of his primary care physician Dr. Clarene Duke in [place].  Kerin Perna, M.D. Electronically Signed  PV/MEDQ  D:  08/19/2010  T:  08/20/2010  Job:  [id number]

## 2012-05-02 IMAGING — CT CT CHEST W/O CM
2 of 3 series · 15 of 36 positions shown, 18 images · non-contrast
Comparison: 01/29/2010 chest radiograph

CLINICAL DATA: Pneumonia, empyema.  Status post right thoracentesis
01/26/2010

CT CHEST WITHOUT CONTRAST
TECHNIQUE: Multidetector CT imaging of the chest was performed
following the standard protocol without IV contrast.

[Series 2: routine chest · axial · 0.76mm/px · z∈[-242,-17]mm · 12 of 53 slices shown, 15 images]
[im 4/53  mediastinal]
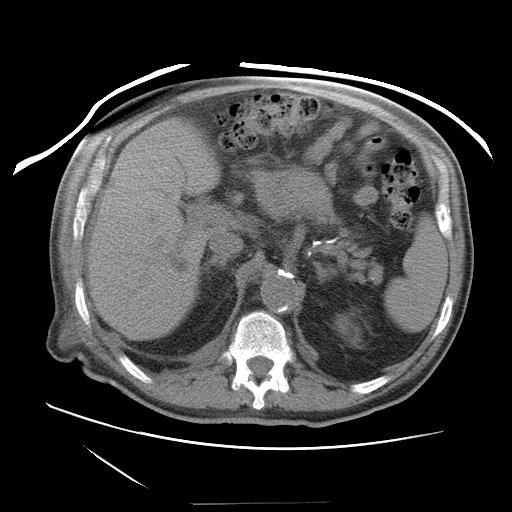
[im 4/53  lung]
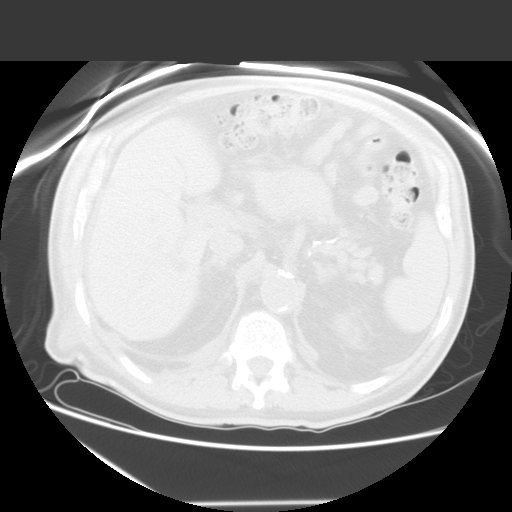
[im 8/53  lung]
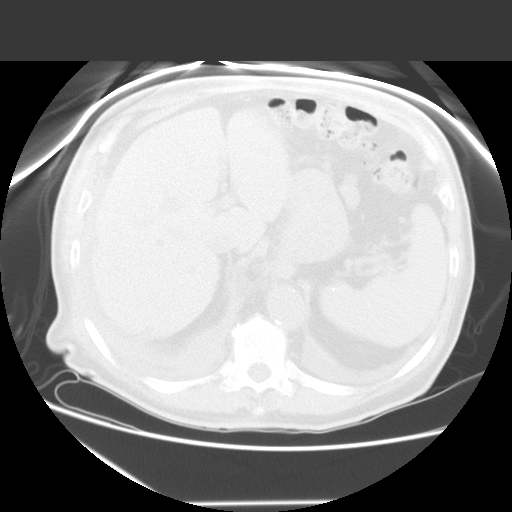
[im 12/53  lung]
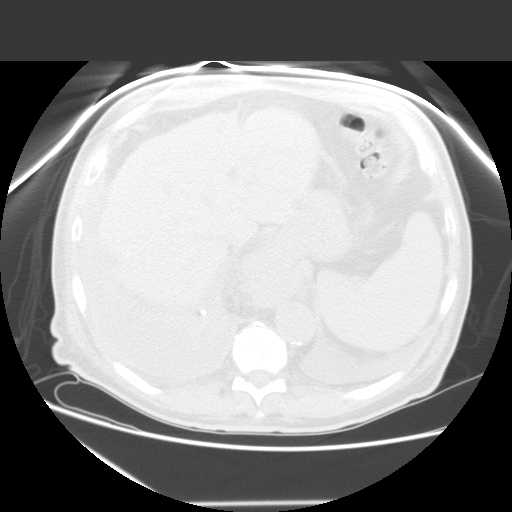
[im 16/53  lung]
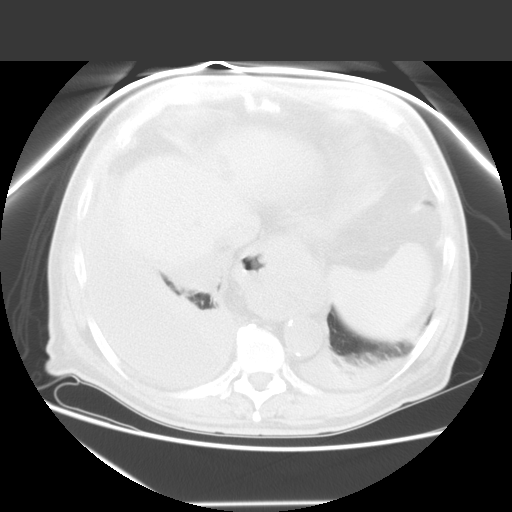
[im 20/53  mediastinal]
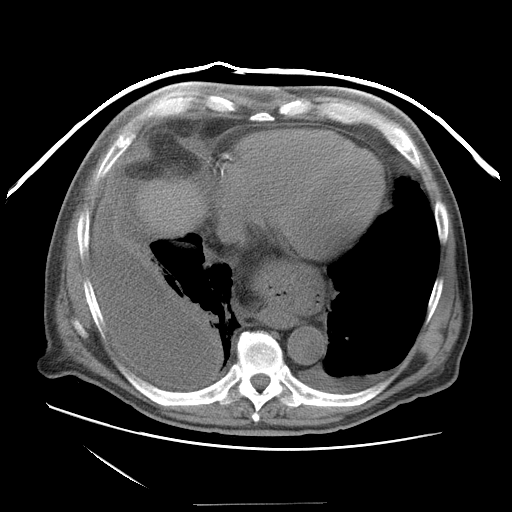
[im 20/53  lung]
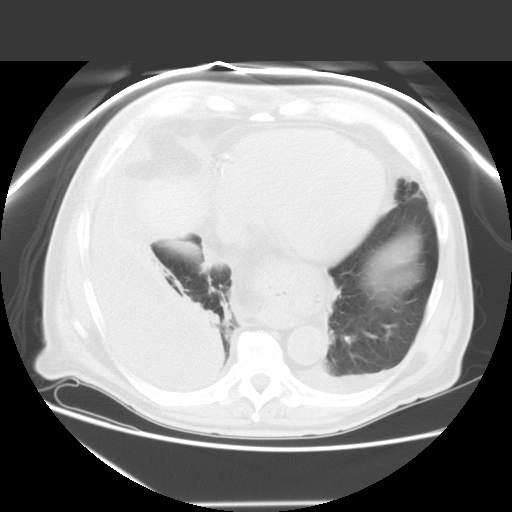
[im 24/53  lung]
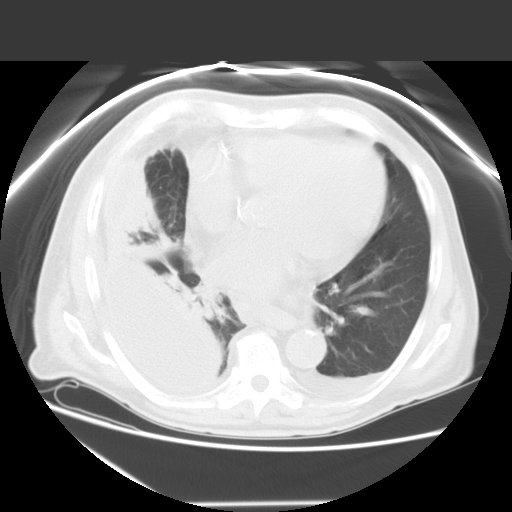
[im 29/53  lung]
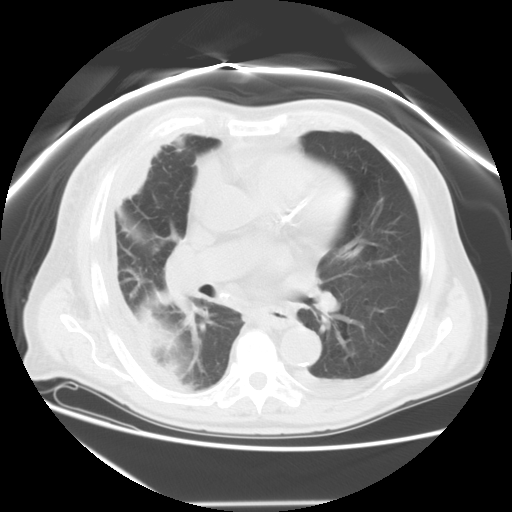
[im 33/53  lung]
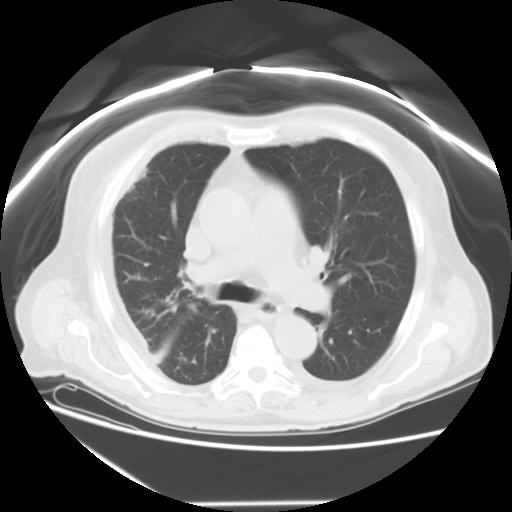
[im 37/53  mediastinal]
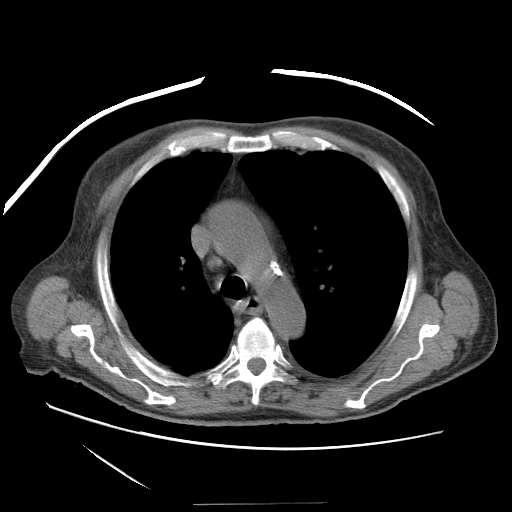
[im 37/53  lung]
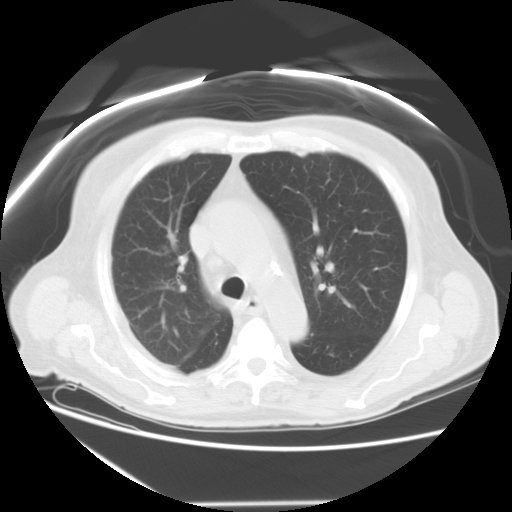
[im 41/53  lung]
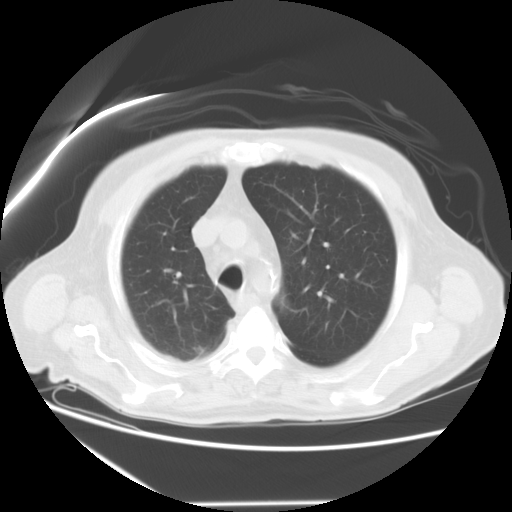
[im 45/53  lung]
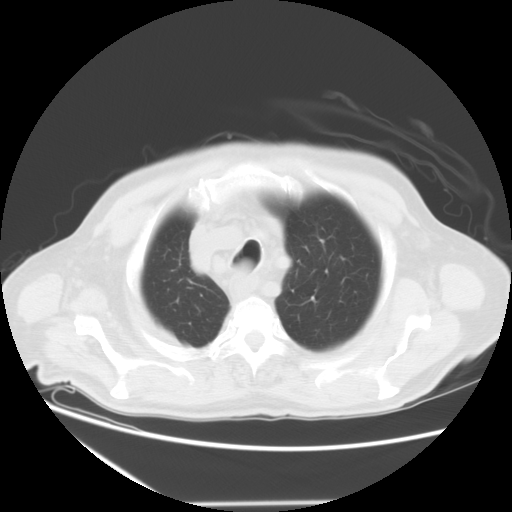
[im 49/53  lung]
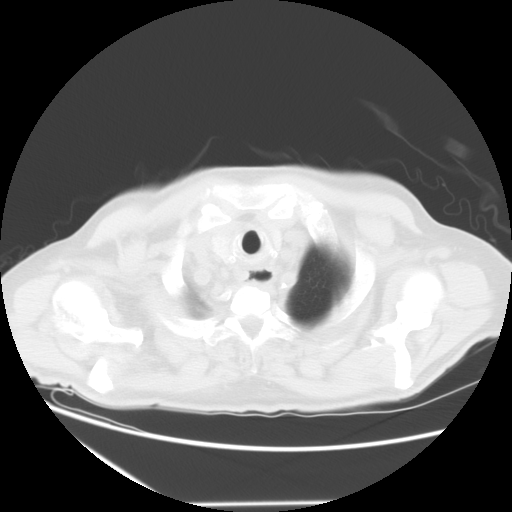

[Series 401: coronal · coronal · 0.76mm/px · 3 of 89 slices shown]
[im 18/89  lung]
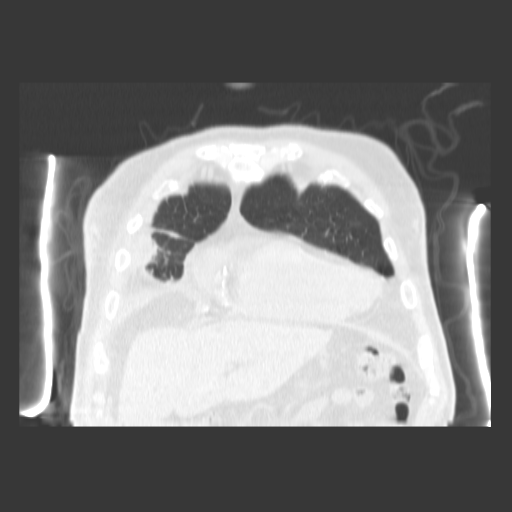
[im 36/89  lung]
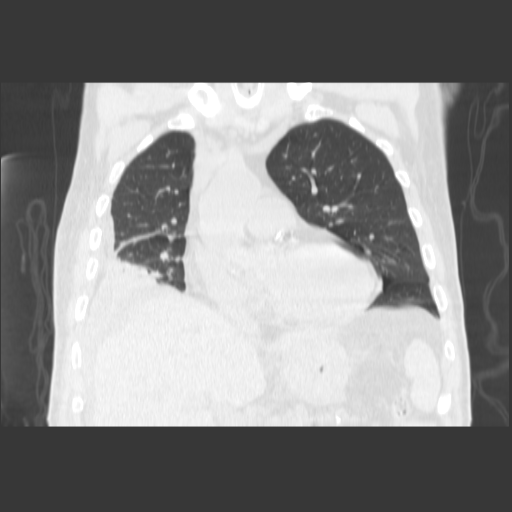
[im 53/89  lung]
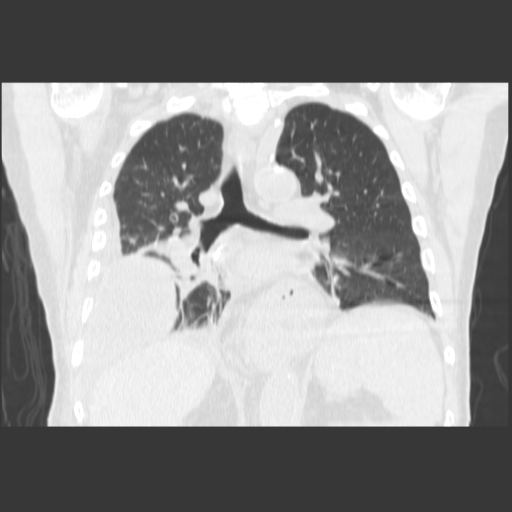

[15 of 36 positions shown; findings below may reference images not displayed]

FINDINGS: Prominent superior mediastinal and tracheal lymph nodes
are noted, with representative pretracheal lymph node measuring
cm on image 12.  Heart size is mildly enlarged.  No pericardial
effusion.  Large hiatal hernia noted.  Partly loculated right
pleural effusion with density 23 HU.  This is minimally higher than
expected for typical simple fluid.  Trace left effusion is noted.

Central airways are patent.

Right lower lobe compressive atelectasis is noted.  Nodular area of
superior segment right lower lobe consolidation measures 1.0 x
cm on image 22.  No acute osseous finding.
IMPRESSION: Partially loculated right sided moderate pleural effusion with
density higher than expected for a simple fluid.  This is
compatible with provided history of empyema.

Right lower lobe adjacent atelectasis; underlying pneumonia could
be obscured.  A more focal nodular area of consolidation is most
likely infectious, although follow-up chest CT is recommended in 6
months to determine resolution.

## 2014-04-19 DIAGNOSIS — H25013 Cortical age-related cataract, bilateral: Secondary | ICD-10-CM | POA: Diagnosis not present

## 2014-04-19 DIAGNOSIS — H40012 Open angle with borderline findings, low risk, left eye: Secondary | ICD-10-CM | POA: Diagnosis not present

## 2014-04-19 DIAGNOSIS — H2513 Age-related nuclear cataract, bilateral: Secondary | ICD-10-CM | POA: Diagnosis not present

## 2014-04-19 DIAGNOSIS — B399 Histoplasmosis, unspecified: Secondary | ICD-10-CM | POA: Diagnosis not present

## 2014-05-20 DIAGNOSIS — H2511 Age-related nuclear cataract, right eye: Secondary | ICD-10-CM | POA: Diagnosis not present

## 2014-05-20 DIAGNOSIS — H25011 Cortical age-related cataract, right eye: Secondary | ICD-10-CM | POA: Diagnosis not present

## 2014-06-05 DIAGNOSIS — E785 Hyperlipidemia, unspecified: Secondary | ICD-10-CM | POA: Diagnosis not present

## 2014-06-05 DIAGNOSIS — I252 Old myocardial infarction: Secondary | ICD-10-CM | POA: Diagnosis not present

## 2014-06-05 DIAGNOSIS — I2510 Atherosclerotic heart disease of native coronary artery without angina pectoris: Secondary | ICD-10-CM | POA: Diagnosis not present

## 2014-06-05 DIAGNOSIS — I10 Essential (primary) hypertension: Secondary | ICD-10-CM | POA: Diagnosis not present

## 2014-06-25 DIAGNOSIS — H25011 Cortical age-related cataract, right eye: Secondary | ICD-10-CM | POA: Diagnosis not present

## 2014-06-25 DIAGNOSIS — H5703 Miosis: Secondary | ICD-10-CM | POA: Diagnosis not present

## 2014-06-25 DIAGNOSIS — H2181 Floppy iris syndrome: Secondary | ICD-10-CM | POA: Diagnosis not present

## 2014-06-25 DIAGNOSIS — H2511 Age-related nuclear cataract, right eye: Secondary | ICD-10-CM | POA: Diagnosis not present

## 2014-07-05 DIAGNOSIS — H2512 Age-related nuclear cataract, left eye: Secondary | ICD-10-CM | POA: Diagnosis not present

## 2014-07-05 DIAGNOSIS — H25012 Cortical age-related cataract, left eye: Secondary | ICD-10-CM | POA: Diagnosis not present

## 2014-07-09 DIAGNOSIS — H25012 Cortical age-related cataract, left eye: Secondary | ICD-10-CM | POA: Diagnosis not present

## 2014-07-09 DIAGNOSIS — H5703 Miosis: Secondary | ICD-10-CM | POA: Diagnosis not present

## 2014-07-09 DIAGNOSIS — H2181 Floppy iris syndrome: Secondary | ICD-10-CM | POA: Diagnosis not present

## 2014-07-09 DIAGNOSIS — H25013 Cortical age-related cataract, bilateral: Secondary | ICD-10-CM | POA: Diagnosis not present

## 2014-07-09 DIAGNOSIS — H2513 Age-related nuclear cataract, bilateral: Secondary | ICD-10-CM | POA: Diagnosis not present

## 2014-07-09 DIAGNOSIS — H2512 Age-related nuclear cataract, left eye: Secondary | ICD-10-CM | POA: Diagnosis not present

## 2014-07-12 DIAGNOSIS — I10 Essential (primary) hypertension: Secondary | ICD-10-CM | POA: Diagnosis not present

## 2014-07-12 DIAGNOSIS — I2510 Atherosclerotic heart disease of native coronary artery without angina pectoris: Secondary | ICD-10-CM | POA: Diagnosis not present

## 2014-07-12 DIAGNOSIS — E785 Hyperlipidemia, unspecified: Secondary | ICD-10-CM | POA: Diagnosis not present

## 2014-07-12 DIAGNOSIS — I252 Old myocardial infarction: Secondary | ICD-10-CM | POA: Diagnosis not present

## 2014-08-12 DIAGNOSIS — H40012 Open angle with borderline findings, low risk, left eye: Secondary | ICD-10-CM | POA: Diagnosis not present

## 2014-08-28 DIAGNOSIS — Z6826 Body mass index (BMI) 26.0-26.9, adult: Secondary | ICD-10-CM | POA: Diagnosis not present

## 2014-08-28 DIAGNOSIS — I252 Old myocardial infarction: Secondary | ICD-10-CM | POA: Diagnosis not present

## 2014-08-28 DIAGNOSIS — I10 Essential (primary) hypertension: Secondary | ICD-10-CM | POA: Diagnosis not present

## 2014-08-28 DIAGNOSIS — I2510 Atherosclerotic heart disease of native coronary artery without angina pectoris: Secondary | ICD-10-CM | POA: Diagnosis not present

## 2014-10-09 DIAGNOSIS — M65332 Trigger finger, left middle finger: Secondary | ICD-10-CM | POA: Diagnosis not present

## 2014-12-11 DIAGNOSIS — M65332 Trigger finger, left middle finger: Secondary | ICD-10-CM | POA: Diagnosis not present

## 2015-01-10 DIAGNOSIS — J449 Chronic obstructive pulmonary disease, unspecified: Secondary | ICD-10-CM | POA: Diagnosis not present

## 2015-01-10 DIAGNOSIS — E785 Hyperlipidemia, unspecified: Secondary | ICD-10-CM | POA: Diagnosis not present

## 2015-01-10 DIAGNOSIS — I10 Essential (primary) hypertension: Secondary | ICD-10-CM | POA: Diagnosis not present

## 2015-01-10 DIAGNOSIS — K219 Gastro-esophageal reflux disease without esophagitis: Secondary | ICD-10-CM | POA: Diagnosis not present

## 2015-01-10 DIAGNOSIS — M65332 Trigger finger, left middle finger: Secondary | ICD-10-CM | POA: Diagnosis not present

## 2015-01-10 DIAGNOSIS — E7800 Pure hypercholesterolemia, unspecified: Secondary | ICD-10-CM | POA: Diagnosis not present

## 2015-01-10 DIAGNOSIS — Z9049 Acquired absence of other specified parts of digestive tract: Secondary | ICD-10-CM | POA: Diagnosis not present

## 2015-02-05 DIAGNOSIS — R0602 Shortness of breath: Secondary | ICD-10-CM | POA: Diagnosis not present

## 2015-02-05 DIAGNOSIS — I2510 Atherosclerotic heart disease of native coronary artery without angina pectoris: Secondary | ICD-10-CM | POA: Diagnosis not present

## 2015-02-05 DIAGNOSIS — E7800 Pure hypercholesterolemia, unspecified: Secondary | ICD-10-CM | POA: Diagnosis not present

## 2015-02-05 DIAGNOSIS — I10 Essential (primary) hypertension: Secondary | ICD-10-CM | POA: Diagnosis not present

## 2015-02-05 DIAGNOSIS — I252 Old myocardial infarction: Secondary | ICD-10-CM | POA: Diagnosis not present

## 2015-02-13 DIAGNOSIS — H52223 Regular astigmatism, bilateral: Secondary | ICD-10-CM | POA: Diagnosis not present

## 2015-02-13 DIAGNOSIS — B399 Histoplasmosis, unspecified: Secondary | ICD-10-CM | POA: Diagnosis not present

## 2015-02-13 DIAGNOSIS — Z961 Presence of intraocular lens: Secondary | ICD-10-CM | POA: Diagnosis not present

## 2015-02-13 DIAGNOSIS — H40012 Open angle with borderline findings, low risk, left eye: Secondary | ICD-10-CM | POA: Diagnosis not present

## 2015-03-26 DIAGNOSIS — Z23 Encounter for immunization: Secondary | ICD-10-CM | POA: Diagnosis not present

## 2015-04-10 DIAGNOSIS — I959 Hypotension, unspecified: Secondary | ICD-10-CM | POA: Diagnosis not present

## 2015-04-10 DIAGNOSIS — K219 Gastro-esophageal reflux disease without esophagitis: Secondary | ICD-10-CM | POA: Diagnosis present

## 2015-04-10 DIAGNOSIS — N400 Enlarged prostate without lower urinary tract symptoms: Secondary | ICD-10-CM | POA: Diagnosis not present

## 2015-04-10 DIAGNOSIS — D509 Iron deficiency anemia, unspecified: Secondary | ICD-10-CM | POA: Diagnosis not present

## 2015-04-10 DIAGNOSIS — R739 Hyperglycemia, unspecified: Secondary | ICD-10-CM | POA: Diagnosis not present

## 2015-04-10 DIAGNOSIS — I499 Cardiac arrhythmia, unspecified: Secondary | ICD-10-CM | POA: Diagnosis not present

## 2015-04-10 DIAGNOSIS — E871 Hypo-osmolality and hyponatremia: Secondary | ICD-10-CM | POA: Diagnosis not present

## 2015-04-10 DIAGNOSIS — K921 Melena: Secondary | ICD-10-CM | POA: Diagnosis not present

## 2015-04-10 DIAGNOSIS — Z7982 Long term (current) use of aspirin: Secondary | ICD-10-CM | POA: Diagnosis not present

## 2015-04-10 DIAGNOSIS — J984 Other disorders of lung: Secondary | ICD-10-CM | POA: Diagnosis not present

## 2015-04-10 DIAGNOSIS — E785 Hyperlipidemia, unspecified: Secondary | ICD-10-CM | POA: Diagnosis not present

## 2015-04-10 DIAGNOSIS — D649 Anemia, unspecified: Secondary | ICD-10-CM | POA: Diagnosis not present

## 2015-04-10 DIAGNOSIS — I495 Sick sinus syndrome: Secondary | ICD-10-CM | POA: Diagnosis not present

## 2015-04-10 DIAGNOSIS — K254 Chronic or unspecified gastric ulcer with hemorrhage: Secondary | ICD-10-CM | POA: Diagnosis not present

## 2015-04-10 DIAGNOSIS — E781 Pure hyperglyceridemia: Secondary | ICD-10-CM | POA: Diagnosis not present

## 2015-04-10 DIAGNOSIS — I2510 Atherosclerotic heart disease of native coronary artery without angina pectoris: Secondary | ICD-10-CM | POA: Diagnosis not present

## 2015-04-10 DIAGNOSIS — Z7902 Long term (current) use of antithrombotics/antiplatelets: Secondary | ICD-10-CM | POA: Diagnosis not present

## 2015-04-10 DIAGNOSIS — E876 Hypokalemia: Secondary | ICD-10-CM | POA: Diagnosis not present

## 2015-04-10 DIAGNOSIS — K529 Noninfective gastroenteritis and colitis, unspecified: Secondary | ICD-10-CM | POA: Diagnosis present

## 2015-04-10 DIAGNOSIS — I10 Essential (primary) hypertension: Secondary | ICD-10-CM | POA: Diagnosis not present

## 2015-04-10 DIAGNOSIS — E861 Hypovolemia: Secondary | ICD-10-CM | POA: Diagnosis not present

## 2015-04-10 DIAGNOSIS — N179 Acute kidney failure, unspecified: Secondary | ICD-10-CM | POA: Diagnosis not present

## 2015-04-10 DIAGNOSIS — R001 Bradycardia, unspecified: Secondary | ICD-10-CM | POA: Diagnosis present

## 2015-04-15 DIAGNOSIS — E781 Pure hyperglyceridemia: Secondary | ICD-10-CM | POA: Diagnosis not present

## 2015-04-15 DIAGNOSIS — K921 Melena: Secondary | ICD-10-CM | POA: Diagnosis not present

## 2015-04-15 DIAGNOSIS — Z7902 Long term (current) use of antithrombotics/antiplatelets: Secondary | ICD-10-CM | POA: Diagnosis not present

## 2015-04-15 DIAGNOSIS — E785 Hyperlipidemia, unspecified: Secondary | ICD-10-CM | POA: Diagnosis present

## 2015-04-15 DIAGNOSIS — R0602 Shortness of breath: Secondary | ICD-10-CM | POA: Diagnosis not present

## 2015-04-15 DIAGNOSIS — K5790 Diverticulosis of intestine, part unspecified, without perforation or abscess without bleeding: Secondary | ICD-10-CM | POA: Diagnosis present

## 2015-04-15 DIAGNOSIS — I2510 Atherosclerotic heart disease of native coronary artery without angina pectoris: Secondary | ICD-10-CM | POA: Diagnosis not present

## 2015-04-15 DIAGNOSIS — K254 Chronic or unspecified gastric ulcer with hemorrhage: Secondary | ICD-10-CM | POA: Diagnosis not present

## 2015-04-15 DIAGNOSIS — R001 Bradycardia, unspecified: Secondary | ICD-10-CM | POA: Diagnosis not present

## 2015-04-15 DIAGNOSIS — Z66 Do not resuscitate: Secondary | ICD-10-CM | POA: Diagnosis present

## 2015-04-15 DIAGNOSIS — K922 Gastrointestinal hemorrhage, unspecified: Secondary | ICD-10-CM | POA: Diagnosis not present

## 2015-04-15 DIAGNOSIS — I495 Sick sinus syndrome: Secondary | ICD-10-CM | POA: Diagnosis not present

## 2015-04-15 DIAGNOSIS — R0600 Dyspnea, unspecified: Secondary | ICD-10-CM | POA: Diagnosis not present

## 2015-04-15 DIAGNOSIS — R918 Other nonspecific abnormal finding of lung field: Secondary | ICD-10-CM | POA: Diagnosis not present

## 2015-04-15 DIAGNOSIS — Z7982 Long term (current) use of aspirin: Secondary | ICD-10-CM | POA: Diagnosis not present

## 2015-04-15 DIAGNOSIS — K648 Other hemorrhoids: Secondary | ICD-10-CM | POA: Diagnosis present

## 2015-04-15 DIAGNOSIS — K449 Diaphragmatic hernia without obstruction or gangrene: Secondary | ICD-10-CM | POA: Diagnosis present

## 2015-04-15 DIAGNOSIS — I10 Essential (primary) hypertension: Secondary | ICD-10-CM | POA: Diagnosis present

## 2015-04-15 DIAGNOSIS — D62 Acute posthemorrhagic anemia: Secondary | ICD-10-CM | POA: Diagnosis not present

## 2015-04-15 DIAGNOSIS — D649 Anemia, unspecified: Secondary | ICD-10-CM | POA: Diagnosis not present

## 2015-04-15 DIAGNOSIS — D509 Iron deficiency anemia, unspecified: Secondary | ICD-10-CM | POA: Diagnosis not present

## 2015-04-21 DIAGNOSIS — R062 Wheezing: Secondary | ICD-10-CM | POA: Diagnosis not present

## 2015-04-21 DIAGNOSIS — J3489 Other specified disorders of nose and nasal sinuses: Secondary | ICD-10-CM | POA: Diagnosis not present

## 2015-04-28 DIAGNOSIS — Z5189 Encounter for other specified aftercare: Secondary | ICD-10-CM | POA: Diagnosis not present

## 2015-05-06 DIAGNOSIS — R0781 Pleurodynia: Secondary | ICD-10-CM | POA: Diagnosis not present

## 2015-05-06 DIAGNOSIS — R609 Edema, unspecified: Secondary | ICD-10-CM | POA: Diagnosis not present

## 2015-05-06 DIAGNOSIS — R0789 Other chest pain: Secondary | ICD-10-CM | POA: Diagnosis not present

## 2015-05-08 DIAGNOSIS — Z6825 Body mass index (BMI) 25.0-25.9, adult: Secondary | ICD-10-CM | POA: Diagnosis not present

## 2015-05-08 DIAGNOSIS — I495 Sick sinus syndrome: Secondary | ICD-10-CM | POA: Diagnosis not present

## 2015-05-08 DIAGNOSIS — E7800 Pure hypercholesterolemia, unspecified: Secondary | ICD-10-CM | POA: Diagnosis not present

## 2015-05-08 DIAGNOSIS — I252 Old myocardial infarction: Secondary | ICD-10-CM | POA: Diagnosis not present

## 2015-05-08 DIAGNOSIS — Z950 Presence of cardiac pacemaker: Secondary | ICD-10-CM | POA: Diagnosis not present

## 2015-05-08 DIAGNOSIS — I10 Essential (primary) hypertension: Secondary | ICD-10-CM | POA: Diagnosis not present

## 2015-05-08 DIAGNOSIS — I2510 Atherosclerotic heart disease of native coronary artery without angina pectoris: Secondary | ICD-10-CM | POA: Diagnosis not present

## 2015-05-08 DIAGNOSIS — K274 Chronic or unspecified peptic ulcer, site unspecified, with hemorrhage: Secondary | ICD-10-CM | POA: Diagnosis not present

## 2015-07-01 DIAGNOSIS — Z125 Encounter for screening for malignant neoplasm of prostate: Secondary | ICD-10-CM | POA: Diagnosis not present

## 2015-07-01 DIAGNOSIS — N400 Enlarged prostate without lower urinary tract symptoms: Secondary | ICD-10-CM | POA: Diagnosis not present

## 2015-07-01 DIAGNOSIS — Z6825 Body mass index (BMI) 25.0-25.9, adult: Secondary | ICD-10-CM | POA: Diagnosis not present

## 2015-07-01 DIAGNOSIS — E785 Hyperlipidemia, unspecified: Secondary | ICD-10-CM | POA: Diagnosis not present

## 2015-07-01 DIAGNOSIS — D649 Anemia, unspecified: Secondary | ICD-10-CM | POA: Diagnosis not present

## 2015-07-01 DIAGNOSIS — K219 Gastro-esophageal reflux disease without esophagitis: Secondary | ICD-10-CM | POA: Diagnosis not present

## 2015-07-01 DIAGNOSIS — Z79899 Other long term (current) drug therapy: Secondary | ICD-10-CM | POA: Diagnosis not present

## 2015-08-18 DIAGNOSIS — B399 Histoplasmosis, unspecified: Secondary | ICD-10-CM | POA: Diagnosis not present

## 2015-08-18 DIAGNOSIS — H32 Chorioretinal disorders in diseases classified elsewhere: Secondary | ICD-10-CM | POA: Diagnosis not present

## 2015-08-18 DIAGNOSIS — I10 Essential (primary) hypertension: Secondary | ICD-10-CM | POA: Diagnosis not present

## 2015-08-18 DIAGNOSIS — H52223 Regular astigmatism, bilateral: Secondary | ICD-10-CM | POA: Diagnosis not present

## 2015-08-18 DIAGNOSIS — H401134 Primary open-angle glaucoma, bilateral, indeterminate stage: Secondary | ICD-10-CM | POA: Diagnosis not present

## 2015-08-18 DIAGNOSIS — Z961 Presence of intraocular lens: Secondary | ICD-10-CM | POA: Diagnosis not present

## 2015-08-18 DIAGNOSIS — Z8249 Family history of ischemic heart disease and other diseases of the circulatory system: Secondary | ICD-10-CM | POA: Diagnosis not present

## 2015-09-16 NOTE — Progress Notes (Signed)
This encounter was created in error - please disregard.

## 2015-09-17 DIAGNOSIS — I10 Essential (primary) hypertension: Secondary | ICD-10-CM | POA: Diagnosis not present

## 2015-09-17 DIAGNOSIS — I252 Old myocardial infarction: Secondary | ICD-10-CM | POA: Diagnosis not present

## 2015-09-17 DIAGNOSIS — N183 Chronic kidney disease, stage 3 (moderate): Secondary | ICD-10-CM | POA: Diagnosis not present

## 2015-09-17 DIAGNOSIS — E785 Hyperlipidemia, unspecified: Secondary | ICD-10-CM | POA: Diagnosis not present

## 2015-09-22 DIAGNOSIS — Z950 Presence of cardiac pacemaker: Secondary | ICD-10-CM | POA: Diagnosis not present

## 2015-10-21 DIAGNOSIS — T25221A Burn of second degree of right foot, initial encounter: Secondary | ICD-10-CM | POA: Diagnosis not present

## 2015-11-06 DIAGNOSIS — I495 Sick sinus syndrome: Secondary | ICD-10-CM | POA: Diagnosis not present

## 2015-11-06 DIAGNOSIS — Z45018 Encounter for adjustment and management of other part of cardiac pacemaker: Secondary | ICD-10-CM | POA: Diagnosis not present

## 2015-11-11 DIAGNOSIS — H401211 Low-tension glaucoma, right eye, mild stage: Secondary | ICD-10-CM | POA: Diagnosis not present

## 2015-11-11 DIAGNOSIS — H401222 Low-tension glaucoma, left eye, moderate stage: Secondary | ICD-10-CM | POA: Diagnosis not present

## 2015-11-11 DIAGNOSIS — Z961 Presence of intraocular lens: Secondary | ICD-10-CM | POA: Diagnosis not present

## 2015-11-25 DIAGNOSIS — Z23 Encounter for immunization: Secondary | ICD-10-CM | POA: Diagnosis not present

## 2015-12-01 DIAGNOSIS — I252 Old myocardial infarction: Secondary | ICD-10-CM | POA: Diagnosis not present

## 2015-12-01 DIAGNOSIS — I10 Essential (primary) hypertension: Secondary | ICD-10-CM | POA: Diagnosis not present

## 2015-12-01 DIAGNOSIS — E7800 Pure hypercholesterolemia, unspecified: Secondary | ICD-10-CM | POA: Diagnosis not present

## 2015-12-01 DIAGNOSIS — I495 Sick sinus syndrome: Secondary | ICD-10-CM | POA: Diagnosis not present

## 2015-12-01 DIAGNOSIS — I2510 Atherosclerotic heart disease of native coronary artery without angina pectoris: Secondary | ICD-10-CM | POA: Diagnosis not present

## 2015-12-01 DIAGNOSIS — Z6825 Body mass index (BMI) 25.0-25.9, adult: Secondary | ICD-10-CM | POA: Diagnosis not present

## 2015-12-22 DIAGNOSIS — Z950 Presence of cardiac pacemaker: Secondary | ICD-10-CM | POA: Diagnosis not present

## 2016-03-22 DIAGNOSIS — Z950 Presence of cardiac pacemaker: Secondary | ICD-10-CM | POA: Diagnosis not present

## 2016-06-21 DIAGNOSIS — Z950 Presence of cardiac pacemaker: Secondary | ICD-10-CM | POA: Diagnosis not present

## 2016-06-29 DIAGNOSIS — H401211 Low-tension glaucoma, right eye, mild stage: Secondary | ICD-10-CM | POA: Diagnosis not present

## 2016-06-29 DIAGNOSIS — Z961 Presence of intraocular lens: Secondary | ICD-10-CM | POA: Diagnosis not present

## 2016-06-29 DIAGNOSIS — H401222 Low-tension glaucoma, left eye, moderate stage: Secondary | ICD-10-CM | POA: Diagnosis not present

## 2016-08-19 DIAGNOSIS — I252 Old myocardial infarction: Secondary | ICD-10-CM | POA: Diagnosis not present

## 2016-08-19 DIAGNOSIS — I2510 Atherosclerotic heart disease of native coronary artery without angina pectoris: Secondary | ICD-10-CM | POA: Diagnosis not present

## 2016-08-19 DIAGNOSIS — E7800 Pure hypercholesterolemia, unspecified: Secondary | ICD-10-CM | POA: Diagnosis not present

## 2016-08-19 DIAGNOSIS — I495 Sick sinus syndrome: Secondary | ICD-10-CM | POA: Diagnosis not present

## 2016-08-19 DIAGNOSIS — Z6824 Body mass index (BMI) 24.0-24.9, adult: Secondary | ICD-10-CM | POA: Diagnosis not present

## 2016-08-19 DIAGNOSIS — I10 Essential (primary) hypertension: Secondary | ICD-10-CM | POA: Diagnosis not present

## 2016-08-19 DIAGNOSIS — I480 Paroxysmal atrial fibrillation: Secondary | ICD-10-CM | POA: Diagnosis not present

## 2016-09-20 DIAGNOSIS — Z950 Presence of cardiac pacemaker: Secondary | ICD-10-CM | POA: Diagnosis not present

## 2016-11-26 DIAGNOSIS — S51802A Unspecified open wound of left forearm, initial encounter: Secondary | ICD-10-CM | POA: Diagnosis not present

## 2016-12-07 DIAGNOSIS — Z23 Encounter for immunization: Secondary | ICD-10-CM | POA: Diagnosis not present

## 2016-12-27 DIAGNOSIS — S92302A Fracture of unspecified metatarsal bone(s), left foot, initial encounter for closed fracture: Secondary | ICD-10-CM | POA: Diagnosis not present

## 2016-12-30 DIAGNOSIS — H401222 Low-tension glaucoma, left eye, moderate stage: Secondary | ICD-10-CM | POA: Diagnosis not present

## 2016-12-30 DIAGNOSIS — H401211 Low-tension glaucoma, right eye, mild stage: Secondary | ICD-10-CM | POA: Diagnosis not present

## 2017-01-03 DIAGNOSIS — S92301A Fracture of unspecified metatarsal bone(s), right foot, initial encounter for closed fracture: Secondary | ICD-10-CM | POA: Diagnosis not present

## 2017-01-31 DIAGNOSIS — Z950 Presence of cardiac pacemaker: Secondary | ICD-10-CM | POA: Diagnosis not present

## 2017-02-01 DIAGNOSIS — S92301A Fracture of unspecified metatarsal bone(s), right foot, initial encounter for closed fracture: Secondary | ICD-10-CM | POA: Diagnosis not present

## 2017-03-15 DIAGNOSIS — S92301A Fracture of unspecified metatarsal bone(s), right foot, initial encounter for closed fracture: Secondary | ICD-10-CM | POA: Diagnosis not present

## 2017-05-02 DIAGNOSIS — Z950 Presence of cardiac pacemaker: Secondary | ICD-10-CM | POA: Diagnosis not present

## 2017-06-03 DIAGNOSIS — B399 Histoplasmosis, unspecified: Secondary | ICD-10-CM | POA: Diagnosis not present

## 2017-06-03 DIAGNOSIS — Z950 Presence of cardiac pacemaker: Secondary | ICD-10-CM | POA: Diagnosis not present

## 2017-06-03 DIAGNOSIS — Z961 Presence of intraocular lens: Secondary | ICD-10-CM | POA: Diagnosis not present

## 2017-06-03 DIAGNOSIS — H52223 Regular astigmatism, bilateral: Secondary | ICD-10-CM | POA: Diagnosis not present

## 2017-06-03 DIAGNOSIS — H32 Chorioretinal disorders in diseases classified elsewhere: Secondary | ICD-10-CM | POA: Diagnosis not present

## 2017-06-03 DIAGNOSIS — H401211 Low-tension glaucoma, right eye, mild stage: Secondary | ICD-10-CM | POA: Diagnosis not present

## 2017-06-03 DIAGNOSIS — H4912 Fourth [trochlear] nerve palsy, left eye: Secondary | ICD-10-CM | POA: Diagnosis not present

## 2017-06-03 DIAGNOSIS — H401222 Low-tension glaucoma, left eye, moderate stage: Secondary | ICD-10-CM | POA: Diagnosis not present

## 2017-06-15 DIAGNOSIS — Z87891 Personal history of nicotine dependence: Secondary | ICD-10-CM | POA: Diagnosis not present

## 2017-06-15 DIAGNOSIS — Z7982 Long term (current) use of aspirin: Secondary | ICD-10-CM | POA: Diagnosis not present

## 2017-06-15 DIAGNOSIS — S6991XA Unspecified injury of right wrist, hand and finger(s), initial encounter: Secondary | ICD-10-CM | POA: Diagnosis not present

## 2017-06-15 DIAGNOSIS — S60211A Contusion of right wrist, initial encounter: Secondary | ICD-10-CM | POA: Diagnosis not present

## 2017-06-15 DIAGNOSIS — I2510 Atherosclerotic heart disease of native coronary artery without angina pectoris: Secondary | ICD-10-CM | POA: Diagnosis not present

## 2017-06-15 DIAGNOSIS — S01111A Laceration without foreign body of right eyelid and periocular area, initial encounter: Secondary | ICD-10-CM | POA: Diagnosis not present

## 2017-06-15 DIAGNOSIS — M4312 Spondylolisthesis, cervical region: Secondary | ICD-10-CM | POA: Diagnosis not present

## 2017-06-15 DIAGNOSIS — I252 Old myocardial infarction: Secondary | ICD-10-CM | POA: Diagnosis not present

## 2017-06-15 DIAGNOSIS — M19031 Primary osteoarthritis, right wrist: Secondary | ICD-10-CM | POA: Diagnosis not present

## 2017-06-15 DIAGNOSIS — S0181XA Laceration without foreign body of other part of head, initial encounter: Secondary | ICD-10-CM | POA: Diagnosis not present

## 2017-06-15 DIAGNOSIS — I459 Conduction disorder, unspecified: Secondary | ICD-10-CM | POA: Diagnosis not present

## 2017-06-15 DIAGNOSIS — I10 Essential (primary) hypertension: Secondary | ICD-10-CM | POA: Diagnosis not present

## 2017-06-15 DIAGNOSIS — W19XXXA Unspecified fall, initial encounter: Secondary | ICD-10-CM | POA: Diagnosis not present

## 2017-06-15 DIAGNOSIS — M47812 Spondylosis without myelopathy or radiculopathy, cervical region: Secondary | ICD-10-CM | POA: Diagnosis not present

## 2017-06-15 DIAGNOSIS — S0990XA Unspecified injury of head, initial encounter: Secondary | ICD-10-CM | POA: Diagnosis not present

## 2017-06-15 DIAGNOSIS — S61511A Laceration without foreign body of right wrist, initial encounter: Secondary | ICD-10-CM | POA: Diagnosis not present

## 2017-06-15 DIAGNOSIS — S0993XA Unspecified injury of face, initial encounter: Secondary | ICD-10-CM | POA: Diagnosis not present

## 2017-06-15 DIAGNOSIS — Z79899 Other long term (current) drug therapy: Secondary | ICD-10-CM | POA: Diagnosis not present

## 2017-06-15 DIAGNOSIS — S199XXA Unspecified injury of neck, initial encounter: Secondary | ICD-10-CM | POA: Diagnosis not present

## 2017-06-15 DIAGNOSIS — E785 Hyperlipidemia, unspecified: Secondary | ICD-10-CM | POA: Diagnosis not present

## 2017-06-16 DIAGNOSIS — Z961 Presence of intraocular lens: Secondary | ICD-10-CM | POA: Diagnosis not present

## 2017-06-16 DIAGNOSIS — H401222 Low-tension glaucoma, left eye, moderate stage: Secondary | ICD-10-CM | POA: Diagnosis not present

## 2017-06-16 DIAGNOSIS — H401211 Low-tension glaucoma, right eye, mild stage: Secondary | ICD-10-CM | POA: Diagnosis not present

## 2017-06-16 DIAGNOSIS — H4912 Fourth [trochlear] nerve palsy, left eye: Secondary | ICD-10-CM | POA: Diagnosis not present

## 2017-06-16 DIAGNOSIS — S0181XA Laceration without foreign body of other part of head, initial encounter: Secondary | ICD-10-CM | POA: Diagnosis not present

## 2017-06-16 DIAGNOSIS — S0531XA Ocular laceration without prolapse or loss of intraocular tissue, right eye, initial encounter: Secondary | ICD-10-CM | POA: Diagnosis not present

## 2017-06-17 DIAGNOSIS — S60819A Abrasion of unspecified wrist, initial encounter: Secondary | ICD-10-CM | POA: Diagnosis not present

## 2017-06-22 DIAGNOSIS — T148XXA Other injury of unspecified body region, initial encounter: Secondary | ICD-10-CM | POA: Diagnosis not present

## 2017-06-22 DIAGNOSIS — L03119 Cellulitis of unspecified part of limb: Secondary | ICD-10-CM | POA: Diagnosis not present

## 2017-08-01 DIAGNOSIS — Z950 Presence of cardiac pacemaker: Secondary | ICD-10-CM | POA: Diagnosis not present

## 2017-08-01 DIAGNOSIS — Z45010 Encounter for checking and testing of cardiac pacemaker pulse generator [battery]: Secondary | ICD-10-CM | POA: Diagnosis not present

## 2017-08-08 DIAGNOSIS — R1084 Generalized abdominal pain: Secondary | ICD-10-CM | POA: Diagnosis not present

## 2017-08-11 DIAGNOSIS — I10 Essential (primary) hypertension: Secondary | ICD-10-CM | POA: Diagnosis not present

## 2017-08-11 DIAGNOSIS — E785 Hyperlipidemia, unspecified: Secondary | ICD-10-CM | POA: Diagnosis not present

## 2017-08-11 DIAGNOSIS — I252 Old myocardial infarction: Secondary | ICD-10-CM | POA: Diagnosis not present

## 2017-08-11 DIAGNOSIS — Z950 Presence of cardiac pacemaker: Secondary | ICD-10-CM | POA: Diagnosis not present

## 2017-08-14 DIAGNOSIS — N183 Chronic kidney disease, stage 3 (moderate): Secondary | ICD-10-CM | POA: Diagnosis not present

## 2017-08-14 DIAGNOSIS — Z7901 Long term (current) use of anticoagulants: Secondary | ICD-10-CM | POA: Diagnosis not present

## 2017-08-14 DIAGNOSIS — R296 Repeated falls: Secondary | ICD-10-CM | POA: Diagnosis not present

## 2017-08-14 DIAGNOSIS — Z950 Presence of cardiac pacemaker: Secondary | ICD-10-CM | POA: Diagnosis not present

## 2017-08-14 DIAGNOSIS — I252 Old myocardial infarction: Secondary | ICD-10-CM | POA: Diagnosis not present

## 2017-08-14 DIAGNOSIS — G8911 Acute pain due to trauma: Secondary | ICD-10-CM | POA: Diagnosis not present

## 2017-08-14 DIAGNOSIS — D631 Anemia in chronic kidney disease: Secondary | ICD-10-CM | POA: Diagnosis not present

## 2017-08-14 DIAGNOSIS — I129 Hypertensive chronic kidney disease with stage 1 through stage 4 chronic kidney disease, or unspecified chronic kidney disease: Secondary | ICD-10-CM | POA: Diagnosis not present

## 2017-08-14 DIAGNOSIS — I495 Sick sinus syndrome: Secondary | ICD-10-CM | POA: Diagnosis not present

## 2017-08-14 DIAGNOSIS — Z9181 History of falling: Secondary | ICD-10-CM | POA: Diagnosis not present

## 2017-08-15 DIAGNOSIS — I252 Old myocardial infarction: Secondary | ICD-10-CM | POA: Diagnosis not present

## 2017-08-15 DIAGNOSIS — D631 Anemia in chronic kidney disease: Secondary | ICD-10-CM | POA: Diagnosis not present

## 2017-08-15 DIAGNOSIS — N183 Chronic kidney disease, stage 3 (moderate): Secondary | ICD-10-CM | POA: Diagnosis not present

## 2017-08-15 DIAGNOSIS — G8911 Acute pain due to trauma: Secondary | ICD-10-CM | POA: Diagnosis not present

## 2017-08-15 DIAGNOSIS — I129 Hypertensive chronic kidney disease with stage 1 through stage 4 chronic kidney disease, or unspecified chronic kidney disease: Secondary | ICD-10-CM | POA: Diagnosis not present

## 2017-08-15 DIAGNOSIS — I495 Sick sinus syndrome: Secondary | ICD-10-CM | POA: Diagnosis not present

## 2017-08-17 DIAGNOSIS — N183 Chronic kidney disease, stage 3 (moderate): Secondary | ICD-10-CM | POA: Diagnosis not present

## 2017-08-17 DIAGNOSIS — I129 Hypertensive chronic kidney disease with stage 1 through stage 4 chronic kidney disease, or unspecified chronic kidney disease: Secondary | ICD-10-CM | POA: Diagnosis not present

## 2017-08-17 DIAGNOSIS — D631 Anemia in chronic kidney disease: Secondary | ICD-10-CM | POA: Diagnosis not present

## 2017-08-17 DIAGNOSIS — I252 Old myocardial infarction: Secondary | ICD-10-CM | POA: Diagnosis not present

## 2017-08-17 DIAGNOSIS — G8911 Acute pain due to trauma: Secondary | ICD-10-CM | POA: Diagnosis not present

## 2017-08-17 DIAGNOSIS — I495 Sick sinus syndrome: Secondary | ICD-10-CM | POA: Diagnosis not present

## 2017-08-18 DIAGNOSIS — K5730 Diverticulosis of large intestine without perforation or abscess without bleeding: Secondary | ICD-10-CM | POA: Diagnosis not present

## 2017-08-18 DIAGNOSIS — K449 Diaphragmatic hernia without obstruction or gangrene: Secondary | ICD-10-CM | POA: Diagnosis not present

## 2017-08-18 DIAGNOSIS — R1011 Right upper quadrant pain: Secondary | ICD-10-CM | POA: Diagnosis not present

## 2017-08-18 DIAGNOSIS — R1012 Left upper quadrant pain: Secondary | ICD-10-CM | POA: Diagnosis not present

## 2017-08-18 DIAGNOSIS — R1084 Generalized abdominal pain: Secondary | ICD-10-CM | POA: Diagnosis not present

## 2017-08-19 DIAGNOSIS — I495 Sick sinus syndrome: Secondary | ICD-10-CM | POA: Diagnosis not present

## 2017-08-19 DIAGNOSIS — I129 Hypertensive chronic kidney disease with stage 1 through stage 4 chronic kidney disease, or unspecified chronic kidney disease: Secondary | ICD-10-CM | POA: Diagnosis not present

## 2017-08-19 DIAGNOSIS — I252 Old myocardial infarction: Secondary | ICD-10-CM | POA: Diagnosis not present

## 2017-08-19 DIAGNOSIS — G8911 Acute pain due to trauma: Secondary | ICD-10-CM | POA: Diagnosis not present

## 2017-08-19 DIAGNOSIS — N183 Chronic kidney disease, stage 3 (moderate): Secondary | ICD-10-CM | POA: Diagnosis not present

## 2017-08-19 DIAGNOSIS — D631 Anemia in chronic kidney disease: Secondary | ICD-10-CM | POA: Diagnosis not present

## 2017-08-22 DIAGNOSIS — I252 Old myocardial infarction: Secondary | ICD-10-CM | POA: Diagnosis not present

## 2017-08-22 DIAGNOSIS — N183 Chronic kidney disease, stage 3 (moderate): Secondary | ICD-10-CM | POA: Diagnosis not present

## 2017-08-22 DIAGNOSIS — I495 Sick sinus syndrome: Secondary | ICD-10-CM | POA: Diagnosis not present

## 2017-08-22 DIAGNOSIS — I129 Hypertensive chronic kidney disease with stage 1 through stage 4 chronic kidney disease, or unspecified chronic kidney disease: Secondary | ICD-10-CM | POA: Diagnosis not present

## 2017-08-22 DIAGNOSIS — D631 Anemia in chronic kidney disease: Secondary | ICD-10-CM | POA: Diagnosis not present

## 2017-08-22 DIAGNOSIS — G8911 Acute pain due to trauma: Secondary | ICD-10-CM | POA: Diagnosis not present

## 2017-08-23 DIAGNOSIS — I495 Sick sinus syndrome: Secondary | ICD-10-CM | POA: Diagnosis not present

## 2017-08-23 DIAGNOSIS — N183 Chronic kidney disease, stage 3 (moderate): Secondary | ICD-10-CM | POA: Diagnosis not present

## 2017-08-23 DIAGNOSIS — I252 Old myocardial infarction: Secondary | ICD-10-CM | POA: Diagnosis not present

## 2017-08-23 DIAGNOSIS — G8911 Acute pain due to trauma: Secondary | ICD-10-CM | POA: Diagnosis not present

## 2017-08-23 DIAGNOSIS — I129 Hypertensive chronic kidney disease with stage 1 through stage 4 chronic kidney disease, or unspecified chronic kidney disease: Secondary | ICD-10-CM | POA: Diagnosis not present

## 2017-08-23 DIAGNOSIS — D631 Anemia in chronic kidney disease: Secondary | ICD-10-CM | POA: Diagnosis not present

## 2017-08-26 DIAGNOSIS — M47892 Other spondylosis, cervical region: Secondary | ICD-10-CM | POA: Diagnosis not present

## 2017-08-26 DIAGNOSIS — M25511 Pain in right shoulder: Secondary | ICD-10-CM | POA: Diagnosis not present

## 2017-08-26 DIAGNOSIS — S00431A Contusion of right ear, initial encounter: Secondary | ICD-10-CM | POA: Diagnosis not present

## 2017-08-26 DIAGNOSIS — Z7901 Long term (current) use of anticoagulants: Secondary | ICD-10-CM | POA: Diagnosis not present

## 2017-08-26 DIAGNOSIS — I491 Atrial premature depolarization: Secondary | ICD-10-CM | POA: Diagnosis not present

## 2017-08-26 DIAGNOSIS — Z9181 History of falling: Secondary | ICD-10-CM | POA: Diagnosis not present

## 2017-08-26 DIAGNOSIS — Z79899 Other long term (current) drug therapy: Secondary | ICD-10-CM | POA: Diagnosis not present

## 2017-08-26 DIAGNOSIS — S2232XA Fracture of one rib, left side, initial encounter for closed fracture: Secondary | ICD-10-CM | POA: Diagnosis not present

## 2017-08-26 DIAGNOSIS — S199XXA Unspecified injury of neck, initial encounter: Secondary | ICD-10-CM | POA: Diagnosis not present

## 2017-08-26 DIAGNOSIS — S40011A Contusion of right shoulder, initial encounter: Secondary | ICD-10-CM | POA: Diagnosis not present

## 2017-08-26 DIAGNOSIS — I6782 Cerebral ischemia: Secondary | ICD-10-CM | POA: Diagnosis not present

## 2017-08-26 DIAGNOSIS — S4991XA Unspecified injury of right shoulder and upper arm, initial encounter: Secondary | ICD-10-CM | POA: Diagnosis not present

## 2017-08-26 DIAGNOSIS — S0990XA Unspecified injury of head, initial encounter: Secondary | ICD-10-CM | POA: Diagnosis not present

## 2017-08-29 DIAGNOSIS — H61121 Hematoma of pinna, right ear: Secondary | ICD-10-CM | POA: Diagnosis not present

## 2017-09-05 DIAGNOSIS — R5383 Other fatigue: Secondary | ICD-10-CM | POA: Diagnosis not present

## 2017-09-05 DIAGNOSIS — G8911 Acute pain due to trauma: Secondary | ICD-10-CM | POA: Diagnosis not present

## 2017-09-05 DIAGNOSIS — Z7901 Long term (current) use of anticoagulants: Secondary | ICD-10-CM | POA: Diagnosis not present

## 2017-09-05 DIAGNOSIS — S2242XA Multiple fractures of ribs, left side, initial encounter for closed fracture: Secondary | ICD-10-CM | POA: Diagnosis not present

## 2017-09-05 DIAGNOSIS — I480 Paroxysmal atrial fibrillation: Secondary | ICD-10-CM | POA: Diagnosis present

## 2017-09-05 DIAGNOSIS — H61891 Other specified disorders of right external ear: Secondary | ICD-10-CM | POA: Diagnosis not present

## 2017-09-05 DIAGNOSIS — Z87891 Personal history of nicotine dependence: Secondary | ICD-10-CM | POA: Diagnosis not present

## 2017-09-05 DIAGNOSIS — R2689 Other abnormalities of gait and mobility: Secondary | ICD-10-CM | POA: Diagnosis not present

## 2017-09-05 DIAGNOSIS — R531 Weakness: Secondary | ICD-10-CM | POA: Diagnosis not present

## 2017-09-05 DIAGNOSIS — J9601 Acute respiratory failure with hypoxia: Secondary | ICD-10-CM | POA: Diagnosis present

## 2017-09-05 DIAGNOSIS — I10 Essential (primary) hypertension: Secondary | ICD-10-CM | POA: Diagnosis not present

## 2017-09-05 DIAGNOSIS — Z515 Encounter for palliative care: Secondary | ICD-10-CM | POA: Diagnosis not present

## 2017-09-05 DIAGNOSIS — S2242XD Multiple fractures of ribs, left side, subsequent encounter for fracture with routine healing: Secondary | ICD-10-CM | POA: Diagnosis not present

## 2017-09-05 DIAGNOSIS — N179 Acute kidney failure, unspecified: Secondary | ICD-10-CM | POA: Diagnosis not present

## 2017-09-05 DIAGNOSIS — R0602 Shortness of breath: Secondary | ICD-10-CM | POA: Diagnosis not present

## 2017-09-05 DIAGNOSIS — A419 Sepsis, unspecified organism: Secondary | ICD-10-CM | POA: Diagnosis not present

## 2017-09-05 DIAGNOSIS — H6001 Abscess of right external ear: Secondary | ICD-10-CM | POA: Diagnosis present

## 2017-09-05 DIAGNOSIS — Z9181 History of falling: Secondary | ICD-10-CM | POA: Diagnosis not present

## 2017-09-05 DIAGNOSIS — Z66 Do not resuscitate: Secondary | ICD-10-CM | POA: Diagnosis present

## 2017-09-05 DIAGNOSIS — I495 Sick sinus syndrome: Secondary | ICD-10-CM | POA: Diagnosis present

## 2017-09-05 DIAGNOSIS — R296 Repeated falls: Secondary | ICD-10-CM | POA: Diagnosis present

## 2017-09-05 DIAGNOSIS — I2510 Atherosclerotic heart disease of native coronary artery without angina pectoris: Secondary | ICD-10-CM | POA: Diagnosis present

## 2017-09-05 DIAGNOSIS — I509 Heart failure, unspecified: Secondary | ICD-10-CM | POA: Diagnosis present

## 2017-09-05 DIAGNOSIS — D649 Anemia, unspecified: Secondary | ICD-10-CM | POA: Diagnosis not present

## 2017-09-05 DIAGNOSIS — Z950 Presence of cardiac pacemaker: Secondary | ICD-10-CM | POA: Diagnosis not present

## 2017-09-05 DIAGNOSIS — R7989 Other specified abnormal findings of blood chemistry: Secondary | ICD-10-CM | POA: Diagnosis not present

## 2017-09-05 DIAGNOSIS — H6641 Suppurative otitis media, unspecified, right ear: Secondary | ICD-10-CM | POA: Diagnosis not present

## 2017-09-05 DIAGNOSIS — Z9981 Dependence on supplemental oxygen: Secondary | ICD-10-CM | POA: Diagnosis not present

## 2017-09-05 DIAGNOSIS — E860 Dehydration: Secondary | ICD-10-CM | POA: Diagnosis present

## 2017-09-05 DIAGNOSIS — G9341 Metabolic encephalopathy: Secondary | ICD-10-CM | POA: Diagnosis present

## 2017-09-05 DIAGNOSIS — I110 Hypertensive heart disease with heart failure: Secondary | ICD-10-CM | POA: Diagnosis not present

## 2017-09-05 DIAGNOSIS — R4182 Altered mental status, unspecified: Secondary | ICD-10-CM | POA: Diagnosis not present

## 2017-09-05 DIAGNOSIS — E7800 Pure hypercholesterolemia, unspecified: Secondary | ICD-10-CM | POA: Diagnosis present

## 2017-09-05 DIAGNOSIS — B9689 Other specified bacterial agents as the cause of diseases classified elsewhere: Secondary | ICD-10-CM | POA: Diagnosis present

## 2017-09-05 DIAGNOSIS — R0603 Acute respiratory distress: Secondary | ICD-10-CM | POA: Diagnosis not present

## 2017-09-22 DEATH — deceased
